# Patient Record
Sex: Male | Born: 1950 | Race: White | Hispanic: No | Marital: Married | Smoking: Never smoker
Health system: Southern US, Community
[De-identification: ages and names within clinical notes are randomized; demographics above are authoritative.]

## PROBLEM LIST (undated history)

## (undated) DIAGNOSIS — I10 Essential (primary) hypertension: Secondary | ICD-10-CM

## (undated) DIAGNOSIS — C801 Malignant (primary) neoplasm, unspecified: Secondary | ICD-10-CM

## (undated) DIAGNOSIS — G4730 Sleep apnea, unspecified: Secondary | ICD-10-CM

## (undated) DIAGNOSIS — E785 Hyperlipidemia, unspecified: Secondary | ICD-10-CM

## (undated) DIAGNOSIS — G4733 Obstructive sleep apnea (adult) (pediatric): Secondary | ICD-10-CM

## (undated) DIAGNOSIS — E875 Hyperkalemia: Secondary | ICD-10-CM

## (undated) DIAGNOSIS — R079 Chest pain, unspecified: Secondary | ICD-10-CM

## (undated) DIAGNOSIS — R9431 Abnormal electrocardiogram [ECG] [EKG]: Secondary | ICD-10-CM

## (undated) DIAGNOSIS — I2510 Atherosclerotic heart disease of native coronary artery without angina pectoris: Secondary | ICD-10-CM

## (undated) DIAGNOSIS — Z760 Encounter for issue of repeat prescription: Principal | ICD-10-CM

## (undated) HISTORY — DX: Malignant (primary) neoplasm, unspecified: C80.1

## (undated) HISTORY — DX: Sleep apnea, unspecified: G47.30

## (undated) HISTORY — PX: COLONOSCOPY: SHX174

## (undated) HISTORY — PX: APPENDECTOMY: SHX54

## (undated) HISTORY — DX: Hyperlipidemia, unspecified: E78.5

## (undated) HISTORY — DX: Obstructive sleep apnea (adult) (pediatric): G47.33

## (undated) HISTORY — PX: NASAL SEPTUM SURGERY: SHX37

## (undated) HISTORY — DX: Essential (primary) hypertension: I10

---

## 2002-02-19 ENCOUNTER — Encounter: Payer: Self-pay | Admitting: Internal Medicine

## 2004-02-23 ENCOUNTER — Ambulatory Visit (HOSPITAL_COMMUNITY): Admission: RE | Admit: 2004-02-23 | Discharge: 2004-02-23 | Payer: Self-pay | Admitting: Family Medicine

## 2004-02-26 ENCOUNTER — Ambulatory Visit (HOSPITAL_COMMUNITY): Admission: RE | Admit: 2004-02-26 | Discharge: 2004-02-26 | Payer: Self-pay | Admitting: Family Medicine

## 2005-01-06 ENCOUNTER — Ambulatory Visit (HOSPITAL_COMMUNITY): Admission: RE | Admit: 2005-01-06 | Discharge: 2005-01-06 | Payer: Self-pay | Admitting: Family Medicine

## 2008-03-20 HISTORY — PX: OTHER SURGICAL HISTORY: SHX169

## 2009-05-19 ENCOUNTER — Encounter: Payer: Self-pay | Admitting: Internal Medicine

## 2009-06-24 ENCOUNTER — Ambulatory Visit: Payer: Self-pay | Admitting: Internal Medicine

## 2009-06-24 DIAGNOSIS — G4733 Obstructive sleep apnea (adult) (pediatric): Secondary | ICD-10-CM

## 2009-06-24 DIAGNOSIS — J310 Chronic rhinitis: Secondary | ICD-10-CM

## 2009-06-24 LAB — CONVERTED CEMR LAB
Basophils Absolute: 0 10*3/uL (ref 0.0–0.1)
Basophils Relative: 0.5 % (ref 0.0–3.0)
Eosinophils Absolute: 0.2 10*3/uL (ref 0.0–0.7)
Eosinophils Relative: 3.8 % (ref 0.0–5.0)
HCT: 43.3 % (ref 39.0–52.0)
Hemoglobin: 14.8 g/dL (ref 13.0–17.0)
IgE (Immunoglobulin E), Serum: 15.2 intl units/mL (ref 0.0–180.0)
Lymphocytes Relative: 25.8 % (ref 12.0–46.0)
Lymphs Abs: 1.3 10*3/uL (ref 0.7–4.0)
MCHC: 34.3 g/dL (ref 30.0–36.0)
MCV: 92 fL (ref 78.0–100.0)
Monocytes Absolute: 0.4 10*3/uL (ref 0.1–1.0)
Monocytes Relative: 7 % (ref 3.0–12.0)
Neutro Abs: 3.2 10*3/uL (ref 1.4–7.7)
Neutrophils Relative %: 62.9 % (ref 43.0–77.0)
Platelets: 262 10*3/uL (ref 150.0–400.0)
RBC: 4.71 M/uL (ref 4.22–5.81)
RDW: 13.4 % (ref 11.5–14.6)
WBC: 5.1 10*3/uL (ref 4.5–10.5)

## 2009-08-18 ENCOUNTER — Ambulatory Visit: Payer: Self-pay | Admitting: Internal Medicine

## 2009-08-19 ENCOUNTER — Encounter: Payer: Self-pay | Admitting: Internal Medicine

## 2009-08-20 ENCOUNTER — Ambulatory Visit: Payer: Self-pay | Admitting: Internal Medicine

## 2009-08-24 ENCOUNTER — Ambulatory Visit: Payer: Self-pay | Admitting: Internal Medicine

## 2009-08-27 ENCOUNTER — Ambulatory Visit: Payer: Self-pay | Admitting: Internal Medicine

## 2009-09-01 ENCOUNTER — Ambulatory Visit: Payer: Self-pay | Admitting: Internal Medicine

## 2009-09-07 ENCOUNTER — Ambulatory Visit: Payer: Self-pay | Admitting: Internal Medicine

## 2009-09-10 ENCOUNTER — Ambulatory Visit: Payer: Self-pay | Admitting: Internal Medicine

## 2009-09-14 ENCOUNTER — Ambulatory Visit: Payer: Self-pay | Admitting: Internal Medicine

## 2009-09-17 ENCOUNTER — Ambulatory Visit: Payer: Self-pay | Admitting: Internal Medicine

## 2009-09-24 ENCOUNTER — Ambulatory Visit: Payer: Self-pay | Admitting: Internal Medicine

## 2009-09-28 ENCOUNTER — Ambulatory Visit: Payer: Self-pay | Admitting: Internal Medicine

## 2009-10-01 ENCOUNTER — Ambulatory Visit: Payer: Self-pay | Admitting: Internal Medicine

## 2009-10-05 ENCOUNTER — Ambulatory Visit: Payer: Self-pay | Admitting: Internal Medicine

## 2009-10-08 ENCOUNTER — Ambulatory Visit: Payer: Self-pay | Admitting: Internal Medicine

## 2009-10-12 ENCOUNTER — Ambulatory Visit: Payer: Self-pay | Admitting: Internal Medicine

## 2009-10-15 ENCOUNTER — Ambulatory Visit: Payer: Self-pay | Admitting: Internal Medicine

## 2009-10-26 ENCOUNTER — Ambulatory Visit: Payer: Self-pay | Admitting: Internal Medicine

## 2009-10-29 ENCOUNTER — Ambulatory Visit: Payer: Self-pay | Admitting: Internal Medicine

## 2009-11-01 ENCOUNTER — Ambulatory Visit: Payer: Self-pay | Admitting: Internal Medicine

## 2009-11-05 ENCOUNTER — Ambulatory Visit: Payer: Self-pay | Admitting: Internal Medicine

## 2009-11-08 ENCOUNTER — Ambulatory Visit: Payer: Self-pay | Admitting: Internal Medicine

## 2009-11-11 ENCOUNTER — Ambulatory Visit: Payer: Self-pay | Admitting: Internal Medicine

## 2009-11-15 ENCOUNTER — Ambulatory Visit: Payer: Self-pay | Admitting: Internal Medicine

## 2009-11-19 ENCOUNTER — Ambulatory Visit: Payer: Self-pay | Admitting: Internal Medicine

## 2009-11-24 ENCOUNTER — Ambulatory Visit: Payer: Self-pay | Admitting: Internal Medicine

## 2009-11-24 ENCOUNTER — Telehealth: Payer: Self-pay | Admitting: Internal Medicine

## 2009-11-26 ENCOUNTER — Ambulatory Visit: Payer: Self-pay | Admitting: Internal Medicine

## 2009-11-29 ENCOUNTER — Ambulatory Visit: Payer: Self-pay | Admitting: Internal Medicine

## 2009-11-30 ENCOUNTER — Ambulatory Visit: Payer: Self-pay | Admitting: Internal Medicine

## 2009-12-02 ENCOUNTER — Ambulatory Visit: Payer: Self-pay | Admitting: Internal Medicine

## 2009-12-06 ENCOUNTER — Ambulatory Visit: Payer: Self-pay | Admitting: Internal Medicine

## 2009-12-10 ENCOUNTER — Ambulatory Visit: Payer: Self-pay | Admitting: Internal Medicine

## 2009-12-13 ENCOUNTER — Ambulatory Visit: Payer: Self-pay | Admitting: Internal Medicine

## 2009-12-16 ENCOUNTER — Ambulatory Visit: Payer: Self-pay | Admitting: Internal Medicine

## 2009-12-24 ENCOUNTER — Ambulatory Visit: Payer: Self-pay | Admitting: Internal Medicine

## 2009-12-27 ENCOUNTER — Ambulatory Visit: Payer: Self-pay | Admitting: Internal Medicine

## 2009-12-31 ENCOUNTER — Ambulatory Visit: Payer: Self-pay | Admitting: Internal Medicine

## 2010-01-03 ENCOUNTER — Ambulatory Visit: Payer: Self-pay | Admitting: Internal Medicine

## 2010-01-10 ENCOUNTER — Ambulatory Visit: Payer: Self-pay | Admitting: Internal Medicine

## 2010-01-17 ENCOUNTER — Ambulatory Visit: Payer: Self-pay | Admitting: Internal Medicine

## 2010-01-24 ENCOUNTER — Ambulatory Visit: Payer: Self-pay | Admitting: Internal Medicine

## 2010-02-05 ENCOUNTER — Emergency Department (HOSPITAL_COMMUNITY)
Admission: EM | Admit: 2010-02-05 | Discharge: 2010-02-05 | Payer: Self-pay | Source: Home / Self Care | Admitting: Emergency Medicine

## 2010-02-07 ENCOUNTER — Ambulatory Visit: Payer: Self-pay | Admitting: Internal Medicine

## 2010-02-14 ENCOUNTER — Ambulatory Visit: Payer: Self-pay | Admitting: Internal Medicine

## 2010-02-25 ENCOUNTER — Ambulatory Visit: Payer: Self-pay | Admitting: Internal Medicine

## 2010-03-07 ENCOUNTER — Ambulatory Visit: Payer: Self-pay | Admitting: Internal Medicine

## 2010-03-07 ENCOUNTER — Telehealth (INDEPENDENT_AMBULATORY_CARE_PROVIDER_SITE_OTHER): Payer: Self-pay | Admitting: *Deleted

## 2010-03-25 ENCOUNTER — Telehealth: Payer: Self-pay | Admitting: Internal Medicine

## 2010-04-04 ENCOUNTER — Ambulatory Visit: Payer: Self-pay | Admitting: Internal Medicine

## 2010-04-06 ENCOUNTER — Telehealth (INDEPENDENT_AMBULATORY_CARE_PROVIDER_SITE_OTHER): Payer: Self-pay | Admitting: *Deleted

## 2010-04-06 ENCOUNTER — Ambulatory Visit: Payer: Self-pay | Admitting: Internal Medicine

## 2010-04-08 ENCOUNTER — Ambulatory Visit: Payer: Self-pay | Admitting: Internal Medicine

## 2010-04-12 ENCOUNTER — Encounter: Payer: Self-pay | Admitting: Internal Medicine

## 2010-04-18 ENCOUNTER — Ambulatory Visit: Payer: Self-pay | Admitting: Internal Medicine

## 2010-04-19 NOTE — Assessment & Plan Note (Signed)
Summary: allergy testing/apc   Vital Signs:  Patient profile:   60 year old male Height:      69 inches Weight:      179 pounds BMI:     26.53 O2 Sat:      96 % on Room air Pulse rate:   83 / minute BP sitting:   118 / 82  (left arm) Cuff size:   regular  Vitals Entered By: Reynaldo Minium CMA (August 18, 2009 2:22 PM)  O2 Flow:  Room air  Primary Provider/Referring Provider:  Eric Form  CC:  Allergy testing.  History of Present Illness: CC:  Allergy Consult-Dr.Crossley.  History of Present Illness: June 24, 2009- 59 yoM referred courtesy of Dr Haroldine Laws, asking allergy evaluation. Complains of chronic, adult onset sneeze, retrorbital pressure, nasal congestion, watering eyes. 30 years ago these were more seasonal and he was skin tested in [place], but never on allergy vaccine. now symptoms have become more perennial. he has OSA, dx'd [facility] 02/19/02. Rhinitis is now beginning to interfere with CPAP use. He had septoplasty by Dr Haroldine Laws and further surgery for OSA has been discussed. Has tried Allegra, Clarinex, Nasonex- no help (other nasal sprays burned). Rare use of decongestants. No intolerance to aspirin, contrast dye or latex, foods or stinging insects.  August 18, 2009- rhinitis, OSA/ CPAP Comes for skin testing reporting no major changes and comfortable since last here. They had travelled to [place], noting no change in symptoms while away. CBC- WNL, Eos not elevated IgE- 15.2 Allergy specific IgE profile- not elevated. Skin test- Pos grass, tree, dustmite   Current Medications (verified): 1)  Benicar Hct 20-12.5 Mg Tabs (Olmesartan Medoxomil-Hctz) .... Take 1 By Mouth Once Daily 2)  Allegra 180 Mg Tabs (Fexofenadine Hcl) .... Take 1 By Mouth Once Daily As Needed 3)  Clarinex 5 Mg Tabs (Desloratadine) .... Take 1 By Mouth Once Daily As Needed 4)  Cpap 5)  Adderall Xr 20 Mg Xr24h-Cap (Amphetamine-Dextroamphetamine) .... Take 1 By Mouth  Every  Morning  Allergies (verified): 1)  ! Demerol 2)  ! Codeine  Past History:  Past Medical History: Last updated: 06/24/2009 Allergic rhinitis Obstructive Sleep Apnea- NPSG 02/19/02- RDI 20.2/hr  Past Surgical History: Last updated: 06/24/2009 Nasal septoplasty Appendectomy  Family History: Last updated: 06/24/2009 Allergies-Mother and Father Cancer-Father-skin and prostate.  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    Physical Exam  Mouth:  postnasal drip.   Additional Exam:  General: A/Ox3; pleasant and cooperative, NAD, SKIN: no rash, lesions, very tanned NODES: no lymphadenopathy HEENT: [place]/AT, EOM- WNL, Conjuctivae- clear, PERRLA, TM-WNL, Nose- septal deviation to left, mucus bridging, Throat- clear and wnl, Mallampati  II NECK: Supple w/ fair ROM, JVD- none, normal carotid impulses w/o bruits Thyroid- normal to palpation CHEST: Clear to P&A HEART: RRR, no m/g/r heard ABDOMEN: Soft and nl; HYQ:MVHQ, nl pulses, no edema  NEURO: Grossly intact to observation      Impression & Recommendations:  Problem # 1:  CHRONIC RHINITIS (ICD-472.0)  Positive allergy skin tests, supporting awareness that there is an allergic  component to his rhinitis. We reviewed and gave print info on environmental precautions/ dust control. We discussed allergy vaccine risk benefit and potential for anaphyllaxis. He says he came thnking he was ready to try vaccine. Nothing else has worked well. We  agreed to start and build here,  Orders: Est. Patient Level III (04540) Allergy Puncture Test (98119) Allergy I.D Test (14782)  Problem # 2:  OBSTRUCTIVE SLEEP APNEA (ICD-327.23)  He is compliant and successful with CPAP. No changes needed.  Orders: Est. Patient Level III (95621) Allergy Puncture Test (30865)  Medications Added to Medication List This Visit: 1)  Adderall Xr 20 Mg Xr24h-cap (Amphetamine-dextroamphetamine) .... Take 1 by mouth  every morning  Patient Instructions: 1)  Please schedule a follow-up appointment in 2 months. 2)  I will have allergy lab call you when your allergy vaccine is ready to start.

## 2010-04-19 NOTE — Miscellaneous (Signed)
Summary: Injection Record / [facility]    Injection Record / [facility]    Imported By: Lennie Odor 11/19/2009 09:49:34  _____________________________________________________________________  External Attachment:    Type:   Image     Comment:   External Document

## 2010-04-19 NOTE — Miscellaneous (Signed)
Summary: Order/[place] Christopher Wilcox  Order/[place] Christopher Wilcox   Imported By: Sherian Rein 09/09/2009 08:07:08  _____________________________________________________________________  External Attachment:    Type:   Image     Comment:   External Document

## 2010-04-19 NOTE — Assessment & Plan Note (Signed)
Summary: 2 months/apc   Primary Provider/Referring Provider:  Eric Form  CC:  2 month follow up visit-allergies and asthma. "doing good" .  History of Present Illness: August 18, 2009- rhinitis, OSA/ CPAP Comes for skin testing reporting no major changes and comfortable since last here. They had travelled to [place], noting no change in symptoms while away. CBC- WNL, Eos not elevated IgE- 15.2 Allergy specific IgE profile- not elevated. Skin test- Pos grass, tree, dustmite  November 19, 2009- Rhinits, OSA/ CPAP  Minor increased stuffiness in last 2 weeks consistent with ragweed season.  Allegra 180 usually sufficent. Occasionally takes a sudafed.  Continues CPAP every night- originally prescribed in [facility] by Dr DeCoy who no longer practices. Unknown pressure with a nasal mask. ? [facility]? Gets Adderall from Dr Clelia Croft. Alllergy vaccine is building without problems.       Preventive Screening-Counseling & Management  Alcohol-Tobacco     Smoking Status: never  Current Medications (verified): 1)  Benicar Hct 20-12.5 Mg Tabs (Olmesartan Medoxomil-Hctz) .... Take 1/2  By Mouth Once Daily 2)  Allegra 180 Mg Tabs (Fexofenadine Hcl) .... Take 1 By Mouth Once Daily As Needed 3)  Clarinex 5 Mg Tabs (Desloratadine) .... Take 1 By Mouth Once Daily As Needed 4)  Cpap 5)  Adderall Xr 20 Mg Xr24h-Cap (Amphetamine-Dextroamphetamine) .... Take 1 By Mouth  Every Morning  Allergies (verified): 1)  ! Demerol 2)  ! Codeine  Past History:  Past Medical History: Last updated: 06/24/2009 Allergic rhinitis Obstructive Sleep Apnea- NPSG 02/19/02- RDI 20.2/hr  Past Surgical History: Last updated: 06/24/2009 Nasal septoplasty Appendectomy  Family History: Last updated: 06/24/2009 Allergies-Mother and Father Cancer-Father-skin and prostate.  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    Vital Signs:  Patient profile:   60 year old male Height:      69 inches Weight:      179.50 pounds BMI:     26.60 O2 Sat:      99 % on Room air Pulse rate:   86 / minute BP sitting:   116 / 68  (right arm) Cuff size:   regular  Vitals Entered By: Reynaldo Minium CMA (November 19, 2009 10:38 AM)  O2 Flow:  Room air CC: 2 month follow up visit-allergies and asthma. "doing good"    Physical Exam  Additional Exam:  General: A/Ox3; pleasant and cooperative, NAD, SKIN: no rash, lesions, very tanned NODES: no lymphadenopathy HEENT: [place]/AT, EOM- WNL, Conjuctivae- clear, PERRLA, TM-WNL, Nose- septal deviation to left, mucus bridging, Throat- clear and wnl, Mallampati  II NECK: Supple w/ fair ROM, JVD- none, normal carotid impulses w/o bruits Thyroid- normal to palpation CHEST: Clear to P&A HEART: RRR, no m/g/r heard ABDOMEN: Soft and nl; ZOX:WRUE, nl pulses, no edema  NEURO: Grossly intact to observation      Impression & Recommendations:  Problem # 1:  CHRONIC RHINITIS (ICD-472.0)  Allergy vaccine is building well and he is appropriate with antihistamines and decongestants. At next visit I will probably take vaccine up to 1:10.  Problem # 2:  OBSTRUCTIVE SLEEP APNEA (ICD-327.23)  We will try to track down a copy of his original sleep study and find out his current pressure setting so we will have it when needd.  Medications Added to Medication List This Visit: 1)  Benicar Hct 20-12.5 Mg Tabs (Olmesartan medoxomil-hctz) .... Take 1/2  by mouth once daily 2)  Allergy Vaccine Building   Other Orders: Est. Patient Level III  ([address]) DME Referral (DME)  Patient Instructions: 1)  Please schedule a follow-up appointment in 6 months. 2)  I will ask our [facility] to work with you to get a copy of your original diagnostic sleep study if available and to check with your home care DME company to find your current CPAP pressure. 3)  At your next visit I anticipate taking your vaccine up one last concentration step.

## 2010-04-19 NOTE — Progress Notes (Signed)
Summary: returning call  Phone Note Call from Patient Call back at Work Phone [phone]   Caller: Patient Call For: young Summary of Call: Returning Libby's call. Initial call taken by: Darletta Moll,  November 24, 2009 3:38 PM  Follow-up for Phone Call        I see where msg was left- unsure what was needed exactly so will forward this msg to [facility] box.   Follow-up by: Vernie Murders,  November 24, 2009 3:47 PM  Additional Follow-up for Phone Call Additional follow up Details #1::        Called Dr. Allayne Stack office and obtained Polysomnogram report from 02/19/02. Rhonda Cobb  November 24, 2009 4:31 PM Gave report to Dr. Maple Hudson who stated that this study from 02/19/02 would work, that this was a diag. study. LMOAM for pt that this study we recvd. would work and there wouldn't be any need for pt to repeat sleep study. Advised pt to call me if he had any questions. Rhonda Cobb  November 24, 2009 5:25 PM Report given to Dr. Maple Hudson. Alfonso Ramus  November 24, 2009 5:26 PM

## 2010-04-19 NOTE — Miscellaneous (Signed)
Summary: Injection Record / [facility]    Injection Record / [facility]    Imported By: Lennie Odor 01/25/2010 14:57:07  _____________________________________________________________________  External Attachment:    Type:   Image     Comment:   External Document

## 2010-04-19 NOTE — Letter (Signed)
Summary: Hermelinda Medicus MD  Hermelinda Medicus MD   Imported By: Sherian Rein 06/30/2009 10:22:44  _____________________________________________________________________  External Attachment:    Type:   Image     Comment:   External Document

## 2010-04-19 NOTE — Assessment & Plan Note (Signed)
Summary: allergy problem/ mbw   Vital Signs:  Patient profile:   60 year old male Height:      69 inches Weight:      180.13 pounds BMI:     26.70 O2 Sat:      96 % on Room air Pulse rate:   89 / minute BP sitting:   110 / 80  (left arm) Cuff size:   regular  Vitals Entered By: Reynaldo Minium CMA (June 24, 2009 3:27 PM)  O2 Flow:  Room air  Primary Provider/Referring Provider:  Eric Form  CC:  Allergy Consult-Dr.Crossley.  History of Present Illness: June 24, 2009- 59 yoM referred courtesy of Dr Haroldine Laws, asking allergy evaluation. Complains of chronic, adult onset sneeze, retrorbital pressure, nasal congestion, watering eyes. 30 years ago these were more seasonal and he was skin tested in [place], but never on allergy vaccine. now symptoms have become more perennial. he has OSA, dx'd [facility] 02/19/02. Rhinitis is now beginning to interfere with CPAP use. He had septoplasty by Dr Haroldine Laws and further surgery for OSA has been discussed. Has tried Allegra, Clarinex, Nasonex- no help (other nasal sprays burned). Rare use of decongestants. No intolerance to aspirin, contrast dye or latex, foods or stinging insects.  Current Medications (verified): 1)  Benicar Hct 20-12.5 Mg Tabs (Olmesartan Medoxomil-Hctz) .... Take 1 By Mouth Once Daily 2)  Allegra 180 Mg Tabs (Fexofenadine Hcl) .... Take 1 By Mouth Once Daily As Needed 3)  Clarinex 5 Mg Tabs (Desloratadine) .... Take 1 By Mouth Once Daily As Needed 4)  Vyvanse 50 Mg Caps (Lisdexamfetamine Dimesylate) .... Take 1 By Mouth Once Daily 5)  Cpap  Allergies (verified): 1)  ! Demerol 2)  ! Codeine  Past History:  Family History: Last updated: 06/24/2009 Allergies-Mother and Father Cancer-Father-skin and prostate.  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  The patient denies shortness of breath with activity, shortness of breath at rest, productive cough, non-productive cough, coughing up blood, chest pain, irregular heartbeats, acid heartburn, indigestion, loss of appetite, weight change, abdominal pain, difficulty swallowing, sore throat, tooth/dental problems, headaches, depression, hand/feet swelling, joint stiffness or pain, rash, change in color of mucus, and fever.    Physical Exam  Additional Exam:  General: A/Ox3; pleasant and cooperative, NAD, SKIN: no rash, lesions NODES: no lymphadenopathy HEENT: [place]/AT, EOM- WNL, Conjuctivae- clear, PERRLA, TM-WNL, Nose- septal deviation to left, mucus bridging, Throat- clear and wnl, Mallampati  II NECK: Supple w/ fair ROM, JVD- none, normal carotid impulses w/o bruits Thyroid- normal to palpation CHEST: Clear to P&A HEART: RRR, no m/g/r heard ABDOMEN: Soft and nl; nml bowel sounds; no organomegaly or masses noted ZOX:WRUE, nl pulses, no edema  NEURO: Grossly intact to observation      Impression & Recommendations:  Problem # 1:  CHRONIC RHINITIS (ICD-472.0)  We disccussed allergic vs nonallergic problems and will send in vitro IgE evaliuation then return for allergy skin testing.   Problem # 2:  OBSTRUCTIVE SLEEP APNEA (ICD-327.23)  He is using cpap with pretty good compliance. He isn't describing breakthrough snore or daytime somnolence and his weight is stable, so I don't think we need pressure assessment yet. He is able to adjust the setting of his humidifier and is encouraged to experiment with that for  effect on morning nasaal symptoms. he and Dr Haroldine Laws will discuss surgical alternatives.  Medications Added to Medication List This Visit: 1)  Benicar Hct 20-12.5 Mg Tabs (Olmesartan medoxomil-hctz) .... Take 1 by mouth once daily 2)  Allegra 180 Mg Tabs (Fexofenadine hcl) .... Take 1 by mouth once daily as needed 3)  Clarinex 5 Mg Tabs (Desloratadine) .... Take 1 by mouth once daily as needed 4)  Vyvanse 50 Mg Caps (Lisdexamfetamine dimesylate) .... Take 1 by mouth once daily 5)  Cpap   Other Orders: Consultation Level IV (62130) TLB-CBC Platelet - w/Differential (85025-CBCD) T-"RAST" (Allergy Full Profile) IGE ([id number])  Patient Instructions: 1)  Return as able for allergy skin testing.- Stop all antihistamines 3 days before skin testing, including cold and allergy meds, otc sleep and cough meds.  2)  Try adjusting the humidifier on your cpap up and down a little to see if that makes you more comfortable in the morning. 3)  Please call us back with the name of your CPAP DME company so we can check on settings for our record. 4)  Try sample Omnaris- 2 sprays each  nostril once daily every night at bedtime til you use up the sample.

## 2010-04-19 NOTE — Miscellaneous (Signed)
Summary: Skin Test/[facility]  Skin Test/[facility]   Imported By: Sherian Rein 08/25/2009 13:05:31  _____________________________________________________________________  External Attachment:    Type:   Image     Comment:   External Document

## 2010-04-21 NOTE — Progress Notes (Signed)
Summary: Nasonex not covered-lmtcb x 3  Phone Note From Pharmacy   [facility] Call For: CY  Summary of Call: Re[facility] stating that nasonex is not covered on pt plan. Alternatives are Fluticasone Propionate nasal spray, or Flunisolide Nasal Spray 0.025% , or Triamcinolone ACE nasal spray. Please advise if any of these alternatives are ok for this pt. If not we can initiate a prior authorization.  Initial call taken by: Carron Curie CMA,  April 06, 2010 10:19 AM  Follow-up for Phone Call        ok to change medlist to fluticasone/ Flonase 50 micrograms # 3,   ref x 3 1-2 puffs each nostril once daily   Follow-up by: Waymon Budge MD,  April 06, 2010 2:51 PM  Additional Follow-up for Phone Call Additional follow up Details #1::        Need to let the pt know about this prior to sending in new rx- LMTCB x 1 Vernie Murders  April 06, 2010 2:56 PM   [facility]. Aundra Millet Reynolds LPN  April 07, 2010 4:43 PM   LMOM TCB x3. Boone Master CNA/MA  April 11, 2010 5:14 PM     Additional Follow-up for Phone Call Additional follow up Details #2::    spoke to pt and he is fine with the generic flonase and would like [facility]--rx sent  Follow-up by: Philipp Deputy CMA,  April 11, 2010 5:24 PM  New/Updated Medications: FLUTICASONE PROPIONATE 50 MCG/ACT SUSP (FLUTICASONE PROPIONATE) 2 sprays each nostril once daily Prescriptions: FLUTICASONE PROPIONATE 50 MCG/ACT SUSP (FLUTICASONE PROPIONATE) 2 sprays each nostril once daily  #3 x 3   Entered by:   Philipp Deputy CMA   Authorized by:   Waymon Budge MD   Signed by:   Philipp Deputy CMA on 04/11/2010   Method used:   Faxed to ...       [facility] Environmental education officer)       [address] [address]       [place], [place]  [address]       Ph: [phone]       Fax: [phone]   RxID:   [id number]

## 2010-04-21 NOTE — Progress Notes (Signed)
Summary: refill   Phone Note Call from Patient Call back at Work Phone [phone]   Caller: Patient Call For: Antonie Borjon Summary of Call: requests refill of nasonex- [facility] Initial call taken by: Tivis Ringer, CNA,  March 25, 2010 5:00 PM  Follow-up for Phone Call        rx sent. pt aware.Carron Curie CMA  March 25, 2010 5:11 PM     Prescriptions: NASONEX 50 MCG/ACT SUSP (MOMETASONE FUROATE) Use 2 puffs in each nostril at bedtime  #1 x 3   Entered by:   Carron Curie CMA   Authorized by:   Waymon Budge MD   Signed by:   Carron Curie CMA on 03/25/2010   Method used:   Faxed to ...       [facility] Environmental education officer)       [address] [address]       [place], [place]  [address]       Ph: [phone]       Fax: [phone]   RxID:   [id number]

## 2010-04-21 NOTE — Progress Notes (Signed)
Summary: patient in lobby--sample  Phone Note Call from Patient Call back at Home Phone [phone]   Caller: Patient Call For: young Reason for Call: Talk to Nurse Summary of Call: Patient asking if we have a sample of nasal spray..he has had a lot of sneezing and stuffiness.  Patient in lobby. Initial call taken by: Lehman Prom,  March 07, 2010 1:18 PM  Follow-up for Phone Call        Per Dr Wyvonne Lenz to try Nasonex 2 puffs each nostril at bedtime.  Sample given. Abigail Miyamoto RN  March 07, 2010 1:25 PM     New/Updated Medications: NASONEX 50 MCG/ACT SUSP (MOMETASONE FUROATE) Use 2 puffs in each nostril at bedtime

## 2010-04-21 NOTE — Medication Information (Signed)
Summary: Tax adviser   Imported By: Valinda Hoar 04/12/2010 09:12:45  _____________________________________________________________________  External Attachment:    Type:   Image     Comment:   External Document

## 2010-04-22 DIAGNOSIS — J301 Allergic rhinitis due to pollen: Secondary | ICD-10-CM

## 2010-05-06 ENCOUNTER — Ambulatory Visit (INDEPENDENT_AMBULATORY_CARE_PROVIDER_SITE_OTHER): Payer: Commercial Managed Care - PPO

## 2010-05-06 DIAGNOSIS — J301 Allergic rhinitis due to pollen: Secondary | ICD-10-CM

## 2010-05-11 NOTE — Miscellaneous (Signed)
Summary: Injection Financial risk analyst   Imported By: Sherian Rein 05/04/2010 15:03:21  _____________________________________________________________________  External Attachment:    Type:   Image     Comment:   External Document

## 2010-05-13 ENCOUNTER — Ambulatory Visit (INDEPENDENT_AMBULATORY_CARE_PROVIDER_SITE_OTHER): Payer: Commercial Managed Care - PPO

## 2010-05-13 DIAGNOSIS — J301 Allergic rhinitis due to pollen: Secondary | ICD-10-CM

## 2010-05-18 ENCOUNTER — Ambulatory Visit: Payer: Self-pay | Admitting: Internal Medicine

## 2010-05-20 ENCOUNTER — Ambulatory Visit (INDEPENDENT_AMBULATORY_CARE_PROVIDER_SITE_OTHER): Payer: Commercial Managed Care - PPO

## 2010-05-23 ENCOUNTER — Encounter: Payer: Self-pay | Admitting: Internal Medicine

## 2010-05-23 DIAGNOSIS — J301 Allergic rhinitis due to pollen: Secondary | ICD-10-CM

## 2010-05-31 NOTE — Assessment & Plan Note (Signed)
Summary: ALLERGY/CB  Nurse Visit   Allergies: 1)  ! Demerol 2)  ! Codeine  Orders Added: 1)  Allergy Injection (1) [[address]]

## 2010-06-06 ENCOUNTER — Ambulatory Visit (INDEPENDENT_AMBULATORY_CARE_PROVIDER_SITE_OTHER): Payer: Commercial Managed Care - PPO

## 2010-06-06 ENCOUNTER — Encounter: Payer: Self-pay | Admitting: Internal Medicine

## 2010-06-06 DIAGNOSIS — J301 Allergic rhinitis due to pollen: Secondary | ICD-10-CM

## 2010-06-06 LAB — ELECTROLYTE PANEL
Anion Gap: 11 mmol/L (ref 8–16)
CO2: 31 mmol/L (ref 20–31)
Chloride: 108 mmol/L (ref 98–110)
Potassium: 4.4 mmol/L (ref 3.5–5.1)
Sodium: 145 mmol/L (ref 136–145)

## 2010-06-06 LAB — CBC WITH DIFFERENTIAL
Absolute Baso #: 0.1 10*3/uL (ref 0.0–0.2)
Absolute Eos #: 0.3 10*3/uL (ref 0.0–0.4)
Absolute Lymph #: 3.4 10*3/uL (ref 1.0–4.8)
Absolute Mono #: 0.8 10*3/uL (ref 0.1–1.2)
Absolute Neut #: 4 10*3/uL (ref 1.8–7.7)
Basophils: 1 % (ref 0–2)
Eosinophils %: 4 % (ref 1–4)
Hematocrit: 41.3 % (ref 41–53)
Hemoglobin: 14.1 g/dL (ref 13.5–17.5)
Lymphocytes: 40 % (ref 24–44)
MCH: 30.9 pg (ref 26–34)
MCHC: 34 g/dL (ref 31–37)
MCV: 90.8 fL (ref 80–100)
MPV: 9.3 fL (ref 6.0–12.0)
Monocytes: 9 % (ref 2–11)
Platelet Count: 263 10*3/uL (ref 140–450)
RBC: 4.55 m/uL (ref 4.5–5.9)
RDW: 13.6 % (ref 12.5–15.4)
Seg Neutrophils: 46 % (ref 36–66)
WBC: 8.6 10*3/uL (ref 3.5–11.0)

## 2010-06-06 LAB — HEPATIC FUNCTION PANEL
ALT: 39 U/L (ref 4–40)
AST: 25 U/L (ref 8–36)
Albumin: 4.4 g/dL (ref 3.4–4.8)
Alkaline Phosphatase: 108 U/L — ABNORMAL HIGH (ref 25–100)
Bilirubin, Direct: 0.17 mg/dL (ref 0.0–0.3)
Bilirubin, Indirect: 0.31 mg/dL (ref 0.0–1.0)
Protein, Total: 7 g/dL (ref 6.4–8.3)
Total Bilirubin: 0.48 mg/dL (ref 0.3–1.2)

## 2010-06-06 LAB — CREATININE
Creatinine: 1.03 mg/dL (ref 0.6–1.4)
GFR African American: >60 mL/min (ref >60)
GFR Non-African American: >60 mL/min (ref >60)

## 2010-06-06 LAB — LIPID PANEL
Chol/HDL Ratio: 3.9 (ref <5.0)
Cholesterol: 135 mg/dL (ref <200)
HDL: 35 mg/dL — ABNORMAL LOW (ref >40)
LDL Cholesterol: 70 mg/dL (ref <100)
Triglycerides: 151 mg/dL — ABNORMAL HIGH (ref <150)

## 2010-06-06 LAB — TSH: TSH: 3.16 mIU/L (ref 0.35–5.50)

## 2010-06-06 LAB — PSA SCREENING: PSA: 0.69 ug/L (ref 0–4)

## 2010-06-06 LAB — GLUCOSE, RANDOM: Glucose: 93 mg/dL (ref 74–106)

## 2010-06-06 LAB — BUN: BUN: 15 mg/dL (ref 6–20)

## 2010-06-06 LAB — T4, FREE: Thyroxine, Free: 1.06 ng/dL (ref 0.80–2.00)

## 2010-06-13 ENCOUNTER — Ambulatory Visit (INDEPENDENT_AMBULATORY_CARE_PROVIDER_SITE_OTHER): Payer: Commercial Managed Care - PPO

## 2010-06-13 DIAGNOSIS — J301 Allergic rhinitis due to pollen: Secondary | ICD-10-CM

## 2010-06-14 ENCOUNTER — Encounter: Payer: Self-pay | Admitting: Internal Medicine

## 2010-06-16 ENCOUNTER — Encounter: Payer: Self-pay | Admitting: Internal Medicine

## 2010-06-16 ENCOUNTER — Ambulatory Visit (INDEPENDENT_AMBULATORY_CARE_PROVIDER_SITE_OTHER): Payer: Commercial Managed Care - PPO | Admitting: Internal Medicine

## 2010-06-16 VITALS — BP 118/70 | HR 111 | Ht 69.0 in | Wt 178.0 lb

## 2010-06-16 DIAGNOSIS — G4733 Obstructive sleep apnea (adult) (pediatric): Secondary | ICD-10-CM

## 2010-06-16 DIAGNOSIS — J301 Allergic rhinitis due to pollen: Secondary | ICD-10-CM

## 2010-06-16 NOTE — Assessment & Plan Note (Signed)
Summary: ALLERGY/CB  Nurse Visit   Allergies: 1)  ! Demerol 2)  ! Codeine  Orders Added: 1)  Allergy Injection (1) [[address]] 

## 2010-06-16 NOTE — Assessment & Plan Note (Addendum)
He is using a nasal steroid spray routinely and feels that and allergy vaccine have helped. There may be room for Dr Haroldine Laws to address a fixed nasal obstruction surgically with hopes of improving airflow and CPAP compliance.

## 2010-06-16 NOTE — Progress Notes (Signed)
  Subjective:    Patient ID: Christopher Wilcox, male    DOB: [birth year], 60 y.o.   MRN: [id number]  HPI 74 yoM never smoker, followed here for allergic rhinitis and OSA on CPAP through [facility] at unknown pressure. Sometimes leaves CPAP off because of nonseasonal stuffiness. Did have nasal surgery 20 years ago.- Dr Haroldine Laws. Does use CPAP at least 4 hours on most days, and is not aware of snore or daytime sleepiness. .Allergy vaccine started 08/20/09 is now stable and seems to him to be helping, at 1:50 [facility] with no problems.  Still uses an Adderall ER 20 mg once on most days for residual daytime sleepiness- Dr Clelia Croft. He has talked w/ Dr Haroldine Laws about possible nasal septoplasty. .   Review of Systems See HPI Constitutional:   No weight loss, night sweats,  Fevers, chills, fatigue, lassitude. HEENT:   No headaches,  Difficulty swallowing,  Tooth/dental problems,  Sore throat,                No sneezing, itching, ear ache, Occasional-  nasal congestion, post nasal drip,   CV:  No chest pain,  Orthopnea, PND, swelling in lower extremities, anasarca, dizziness, palpitations  GI  No heartburn, indigestion, abdominal pain, nausea, vomiting, diarrhea, change in bowel habits, loss of appetite  Resp: No shortness of breath with exertion or at rest.  No excess mucus, no productive cough,  No non-productive cough,  No coughing up of blood.  No change in color of mucus.  No wheezing  Skin: no rash or lesions.  GU: no dysuria, change in color of urine, no urgency or frequency.  No flank pain.  MS:  No joint pain or swelling.  No decreased range of motion.  No back pain.  Psych:  No change in mood or affect. No depression or anxiety.  No memory loss.     Objective:   Physical Exam General- Alert, Oriented, Affect-appropriate, Distress- none acute  Skin- rash-none, lesions- none, excoriation- none  Lymphadenopathy- none  Head- atraumatic  Eyes- Gross vision intact, PERRLA, conjunctivae clear,  secretions  Ears- Normal-  Hearing, canals, Tm L ,   R ,  Nose- Clear,  MinimalSeptal dev, No- mucus, polyps, erosion, perforation   Throat- Mallampati II , mucosa clear , drainage- none, tonsils- atrophic  Neck- flexible , trachea midline, no stridor , thyroid nl, carotid no bruit  Chest - symmetrical excursion , unlabored     Heart/CV- RRR , no murmur , no gallop  , no rub, nl s1 s2                     - JVD- none , edema- none, stasis changes- none, varices- none     Lung- clear to P&A, wheeze- none, cough- none , dullness-none, rub- none     Chest wall-   Abd- tender-no, distended-no, bowel sounds-present, HSM- no  Br/ Gen/ Rectal- Not done, not indicated  Extrem- cyanosis- none, clubbing, none, atrophy- none, strength- nl  Neuro- grossly intact to observation        Assessment & Plan:

## 2010-06-16 NOTE — Assessment & Plan Note (Signed)
We need to try to get his pressure setting from his DME. His compliance is adequate and , effectivenes is good. , and limited more by his intermittent nonspecific stuffiness.

## 2010-06-16 NOTE — Patient Instructions (Signed)
We will have the allergy lab advance your vaccine to 1:10 strength with the next order.  We will contact [facility] for your current CPAP setting for our record.   It is ok to explore with Dr Haroldine Laws whether you want to try nasal surgery.

## 2010-06-18 ENCOUNTER — Encounter: Payer: Self-pay | Admitting: Internal Medicine

## 2010-06-22 ENCOUNTER — Ambulatory Visit (INDEPENDENT_AMBULATORY_CARE_PROVIDER_SITE_OTHER): Payer: Commercial Managed Care - PPO

## 2010-06-22 ENCOUNTER — Encounter: Payer: Self-pay | Admitting: Internal Medicine

## 2010-06-22 DIAGNOSIS — J301 Allergic rhinitis due to pollen: Secondary | ICD-10-CM

## 2010-07-04 ENCOUNTER — Ambulatory Visit (INDEPENDENT_AMBULATORY_CARE_PROVIDER_SITE_OTHER): Payer: Commercial Managed Care - PPO

## 2010-07-04 DIAGNOSIS — J309 Allergic rhinitis, unspecified: Secondary | ICD-10-CM

## 2010-07-12 ENCOUNTER — Ambulatory Visit (INDEPENDENT_AMBULATORY_CARE_PROVIDER_SITE_OTHER): Payer: Commercial Managed Care - PPO

## 2010-07-12 DIAGNOSIS — J309 Allergic rhinitis, unspecified: Secondary | ICD-10-CM

## 2010-07-25 ENCOUNTER — Ambulatory Visit (INDEPENDENT_AMBULATORY_CARE_PROVIDER_SITE_OTHER): Payer: Commercial Managed Care - PPO

## 2010-07-25 DIAGNOSIS — J309 Allergic rhinitis, unspecified: Secondary | ICD-10-CM

## 2010-08-01 ENCOUNTER — Ambulatory Visit (INDEPENDENT_AMBULATORY_CARE_PROVIDER_SITE_OTHER): Payer: Commercial Managed Care - PPO

## 2010-08-01 DIAGNOSIS — J309 Allergic rhinitis, unspecified: Secondary | ICD-10-CM

## 2010-08-08 ENCOUNTER — Ambulatory Visit (INDEPENDENT_AMBULATORY_CARE_PROVIDER_SITE_OTHER): Payer: Commercial Managed Care - PPO

## 2010-08-08 DIAGNOSIS — J309 Allergic rhinitis, unspecified: Secondary | ICD-10-CM

## 2010-08-17 ENCOUNTER — Ambulatory Visit (INDEPENDENT_AMBULATORY_CARE_PROVIDER_SITE_OTHER): Payer: Commercial Managed Care - PPO

## 2010-08-17 DIAGNOSIS — J309 Allergic rhinitis, unspecified: Secondary | ICD-10-CM

## 2010-08-22 ENCOUNTER — Encounter: Payer: Self-pay | Admitting: Internal Medicine

## 2010-08-24 ENCOUNTER — Encounter: Payer: Self-pay | Admitting: Gastroenterology

## 2010-08-29 ENCOUNTER — Ambulatory Visit (INDEPENDENT_AMBULATORY_CARE_PROVIDER_SITE_OTHER): Payer: Commercial Managed Care - PPO

## 2010-08-29 DIAGNOSIS — J309 Allergic rhinitis, unspecified: Secondary | ICD-10-CM

## 2010-09-09 ENCOUNTER — Ambulatory Visit (INDEPENDENT_AMBULATORY_CARE_PROVIDER_SITE_OTHER): Payer: Commercial Managed Care - PPO

## 2010-09-09 DIAGNOSIS — J309 Allergic rhinitis, unspecified: Secondary | ICD-10-CM

## 2010-09-19 ENCOUNTER — Ambulatory Visit (INDEPENDENT_AMBULATORY_CARE_PROVIDER_SITE_OTHER): Payer: Commercial Managed Care - PPO

## 2010-09-19 DIAGNOSIS — J309 Allergic rhinitis, unspecified: Secondary | ICD-10-CM

## 2010-09-20 ENCOUNTER — Ambulatory Visit (INDEPENDENT_AMBULATORY_CARE_PROVIDER_SITE_OTHER): Payer: Commercial Managed Care - PPO

## 2010-09-20 DIAGNOSIS — J309 Allergic rhinitis, unspecified: Secondary | ICD-10-CM

## 2010-09-26 ENCOUNTER — Ambulatory Visit (INDEPENDENT_AMBULATORY_CARE_PROVIDER_SITE_OTHER): Payer: Commercial Managed Care - PPO

## 2010-09-26 DIAGNOSIS — J309 Allergic rhinitis, unspecified: Secondary | ICD-10-CM

## 2010-10-04 ENCOUNTER — Ambulatory Visit (INDEPENDENT_AMBULATORY_CARE_PROVIDER_SITE_OTHER): Payer: Commercial Managed Care - PPO

## 2010-10-04 DIAGNOSIS — J309 Allergic rhinitis, unspecified: Secondary | ICD-10-CM

## 2010-10-13 ENCOUNTER — Ambulatory Visit (INDEPENDENT_AMBULATORY_CARE_PROVIDER_SITE_OTHER): Payer: Commercial Managed Care - PPO

## 2010-10-13 DIAGNOSIS — J309 Allergic rhinitis, unspecified: Secondary | ICD-10-CM

## 2010-10-21 ENCOUNTER — Ambulatory Visit (INDEPENDENT_AMBULATORY_CARE_PROVIDER_SITE_OTHER): Payer: Commercial Managed Care - PPO

## 2010-10-21 DIAGNOSIS — J309 Allergic rhinitis, unspecified: Secondary | ICD-10-CM

## 2010-11-01 ENCOUNTER — Ambulatory Visit (INDEPENDENT_AMBULATORY_CARE_PROVIDER_SITE_OTHER): Payer: Commercial Managed Care - PPO

## 2010-11-01 DIAGNOSIS — J309 Allergic rhinitis, unspecified: Secondary | ICD-10-CM

## 2010-11-08 ENCOUNTER — Ambulatory Visit (INDEPENDENT_AMBULATORY_CARE_PROVIDER_SITE_OTHER): Payer: Commercial Managed Care - PPO

## 2010-11-08 DIAGNOSIS — J309 Allergic rhinitis, unspecified: Secondary | ICD-10-CM

## 2010-11-17 ENCOUNTER — Ambulatory Visit (INDEPENDENT_AMBULATORY_CARE_PROVIDER_SITE_OTHER): Payer: Commercial Managed Care - PPO

## 2010-11-17 DIAGNOSIS — J309 Allergic rhinitis, unspecified: Secondary | ICD-10-CM

## 2010-11-25 ENCOUNTER — Ambulatory Visit (INDEPENDENT_AMBULATORY_CARE_PROVIDER_SITE_OTHER): Payer: Commercial Managed Care - PPO

## 2010-11-25 DIAGNOSIS — J309 Allergic rhinitis, unspecified: Secondary | ICD-10-CM

## 2010-12-02 ENCOUNTER — Ambulatory Visit (INDEPENDENT_AMBULATORY_CARE_PROVIDER_SITE_OTHER): Payer: Commercial Managed Care - PPO

## 2010-12-02 DIAGNOSIS — J309 Allergic rhinitis, unspecified: Secondary | ICD-10-CM

## 2010-12-09 ENCOUNTER — Ambulatory Visit (INDEPENDENT_AMBULATORY_CARE_PROVIDER_SITE_OTHER): Payer: Commercial Managed Care - PPO

## 2010-12-09 DIAGNOSIS — J309 Allergic rhinitis, unspecified: Secondary | ICD-10-CM

## 2010-12-13 ENCOUNTER — Encounter: Payer: Self-pay | Admitting: Internal Medicine

## 2010-12-16 ENCOUNTER — Ambulatory Visit: Payer: Commercial Managed Care - PPO | Admitting: Internal Medicine

## 2010-12-19 ENCOUNTER — Ambulatory Visit (INDEPENDENT_AMBULATORY_CARE_PROVIDER_SITE_OTHER): Payer: Commercial Managed Care - PPO

## 2010-12-19 ENCOUNTER — Encounter: Payer: Self-pay | Admitting: Gastroenterology

## 2010-12-19 DIAGNOSIS — J309 Allergic rhinitis, unspecified: Secondary | ICD-10-CM

## 2010-12-23 ENCOUNTER — Ambulatory Visit: Payer: Commercial Managed Care - PPO | Admitting: Internal Medicine

## 2010-12-26 ENCOUNTER — Ambulatory Visit (INDEPENDENT_AMBULATORY_CARE_PROVIDER_SITE_OTHER): Payer: Commercial Managed Care - PPO

## 2010-12-26 DIAGNOSIS — J309 Allergic rhinitis, unspecified: Secondary | ICD-10-CM

## 2011-01-06 ENCOUNTER — Ambulatory Visit (INDEPENDENT_AMBULATORY_CARE_PROVIDER_SITE_OTHER): Payer: Commercial Managed Care - PPO

## 2011-01-06 DIAGNOSIS — J309 Allergic rhinitis, unspecified: Secondary | ICD-10-CM

## 2011-01-13 ENCOUNTER — Ambulatory Visit (INDEPENDENT_AMBULATORY_CARE_PROVIDER_SITE_OTHER): Payer: Commercial Managed Care - PPO

## 2011-01-13 DIAGNOSIS — J309 Allergic rhinitis, unspecified: Secondary | ICD-10-CM

## 2011-01-18 ENCOUNTER — Ambulatory Visit (INDEPENDENT_AMBULATORY_CARE_PROVIDER_SITE_OTHER): Payer: Commercial Managed Care - PPO

## 2011-01-18 ENCOUNTER — Ambulatory Visit (AMBULATORY_SURGERY_CENTER): Payer: Commercial Managed Care - PPO | Admitting: *Deleted

## 2011-01-18 ENCOUNTER — Encounter: Payer: Self-pay | Admitting: Gastroenterology

## 2011-01-18 VITALS — Ht 68.0 in | Wt 181.9 lb

## 2011-01-18 DIAGNOSIS — Z1211 Encounter for screening for malignant neoplasm of colon: Secondary | ICD-10-CM

## 2011-01-18 DIAGNOSIS — J309 Allergic rhinitis, unspecified: Secondary | ICD-10-CM

## 2011-01-18 MED ORDER — MOVIPREP 100 G PO SOLR: ORAL | Status: DC

## 2011-02-03 ENCOUNTER — Ambulatory Visit (AMBULATORY_SURGERY_CENTER): Payer: Commercial Managed Care - PPO | Admitting: Gastroenterology

## 2011-02-03 ENCOUNTER — Encounter: Payer: Self-pay | Admitting: Gastroenterology

## 2011-02-03 VITALS — BP 125/84 | HR 80 | Temp 97.0°F | Resp 17 | Ht 68.0 in | Wt 181.0 lb

## 2011-02-03 DIAGNOSIS — Z1211 Encounter for screening for malignant neoplasm of colon: Secondary | ICD-10-CM

## 2011-02-03 MED ORDER — SODIUM CHLORIDE 0.9 % IV SOLN: 500.0000 mL | INTRAVENOUS | Status: DC

## 2011-02-03 NOTE — Patient Instructions (Signed)
Please refer to your blue and neon green sheets for instructions regarding diet and activity for the rest of today.  You may resume your medications as you would normally take them.   Diverticulosis Diverticulosis is a common condition that develops when small pouches (diverticula) form in the wall of the colon. The risk of diverticulosis increases with age. It happens more often in people who eat a low-fiber diet. Most individuals with diverticulosis have no symptoms. Those individuals with symptoms usually experience abdominal pain, constipation, or loose stools (diarrhea). HOME CARE INSTRUCTIONS   Increase the amount of fiber in your diet as directed by your caregiver or dietician. This may reduce symptoms of diverticulosis.   Your caregiver may recommend taking a dietary fiber supplement.   Drink at least 6 to 8 glasses of water each day to prevent constipation.   Try not to strain when you have a bowel movement.   Your caregiver may recommend avoiding nuts and seeds to prevent complications, although this is still an uncertain benefit.   Only take over-the-counter or prescription medicines for pain, discomfort, or fever as directed by your caregiver.  FOODS WITH HIGH FIBER CONTENT INCLUDE:  Fruits. Apple, peach, pear, tangerine, raisins, prunes.   Vegetables. Brussels sprouts, asparagus, broccoli, cabbage, carrot, cauliflower, romaine lettuce, spinach, summer squash, tomato, winter squash, zucchini.   Starchy Vegetables. Baked beans, kidney beans, lima beans, split peas, lentils, potatoes (with skin).   Grains. Whole wheat bread, brown rice, bran flake cereal, plain oatmeal, white rice, shredded wheat, bran muffins.  SEEK IMMEDIATE MEDICAL CARE IF:   You develop increasing pain or severe bloating.   You have an oral temperature above 102 F (38.9 C), not controlled by medicine.   You develop vomiting or bowel movements that are bloody or black.  Document Released: 12/02/2003  Document Revised: 11/16/2010 Document Reviewed: 08/04/2009 [employer] Patient Information 2012 [employer]. 

## 2011-02-06 ENCOUNTER — Ambulatory Visit (INDEPENDENT_AMBULATORY_CARE_PROVIDER_SITE_OTHER): Payer: Commercial Managed Care - PPO

## 2011-02-06 ENCOUNTER — Telehealth: Payer: Self-pay | Admitting: *Deleted

## 2011-02-06 DIAGNOSIS — J309 Allergic rhinitis, unspecified: Secondary | ICD-10-CM

## 2011-02-06 NOTE — Telephone Encounter (Signed)
No answer. Left message to call as needed.

## 2011-02-15 ENCOUNTER — Ambulatory Visit (INDEPENDENT_AMBULATORY_CARE_PROVIDER_SITE_OTHER): Payer: Commercial Managed Care - PPO

## 2011-02-15 DIAGNOSIS — J309 Allergic rhinitis, unspecified: Secondary | ICD-10-CM

## 2011-02-20 ENCOUNTER — Ambulatory Visit (INDEPENDENT_AMBULATORY_CARE_PROVIDER_SITE_OTHER): Payer: Commercial Managed Care - PPO

## 2011-02-20 DIAGNOSIS — J309 Allergic rhinitis, unspecified: Secondary | ICD-10-CM

## 2011-03-03 ENCOUNTER — Ambulatory Visit (INDEPENDENT_AMBULATORY_CARE_PROVIDER_SITE_OTHER): Payer: Commercial Managed Care - PPO

## 2011-03-03 DIAGNOSIS — J309 Allergic rhinitis, unspecified: Secondary | ICD-10-CM

## 2011-03-10 ENCOUNTER — Ambulatory Visit (INDEPENDENT_AMBULATORY_CARE_PROVIDER_SITE_OTHER): Payer: Commercial Managed Care - PPO

## 2011-03-10 DIAGNOSIS — J309 Allergic rhinitis, unspecified: Secondary | ICD-10-CM

## 2011-03-13 ENCOUNTER — Ambulatory Visit (INDEPENDENT_AMBULATORY_CARE_PROVIDER_SITE_OTHER): Payer: Commercial Managed Care - PPO

## 2011-03-13 DIAGNOSIS — J309 Allergic rhinitis, unspecified: Secondary | ICD-10-CM

## 2011-03-24 ENCOUNTER — Ambulatory Visit (INDEPENDENT_AMBULATORY_CARE_PROVIDER_SITE_OTHER): Payer: Commercial Managed Care - PPO

## 2011-03-24 DIAGNOSIS — J309 Allergic rhinitis, unspecified: Secondary | ICD-10-CM

## 2011-03-27 ENCOUNTER — Other Ambulatory Visit: Payer: Self-pay | Admitting: Internal Medicine

## 2011-04-03 ENCOUNTER — Ambulatory Visit (INDEPENDENT_AMBULATORY_CARE_PROVIDER_SITE_OTHER): Payer: Commercial Managed Care - PPO

## 2011-04-03 DIAGNOSIS — J309 Allergic rhinitis, unspecified: Secondary | ICD-10-CM

## 2011-04-05 ENCOUNTER — Encounter: Payer: Self-pay | Admitting: Internal Medicine

## 2011-04-12 ENCOUNTER — Ambulatory Visit (INDEPENDENT_AMBULATORY_CARE_PROVIDER_SITE_OTHER): Payer: Commercial Managed Care - PPO

## 2011-04-12 DIAGNOSIS — J309 Allergic rhinitis, unspecified: Secondary | ICD-10-CM

## 2011-04-21 ENCOUNTER — Ambulatory Visit (INDEPENDENT_AMBULATORY_CARE_PROVIDER_SITE_OTHER): Payer: Commercial Managed Care - PPO

## 2011-04-21 DIAGNOSIS — J309 Allergic rhinitis, unspecified: Secondary | ICD-10-CM

## 2011-05-05 ENCOUNTER — Ambulatory Visit (INDEPENDENT_AMBULATORY_CARE_PROVIDER_SITE_OTHER): Payer: Commercial Managed Care - PPO

## 2011-05-05 DIAGNOSIS — J309 Allergic rhinitis, unspecified: Secondary | ICD-10-CM

## 2011-05-15 ENCOUNTER — Ambulatory Visit (INDEPENDENT_AMBULATORY_CARE_PROVIDER_SITE_OTHER): Payer: Commercial Managed Care - PPO

## 2011-05-15 DIAGNOSIS — J309 Allergic rhinitis, unspecified: Secondary | ICD-10-CM

## 2011-05-26 ENCOUNTER — Ambulatory Visit (INDEPENDENT_AMBULATORY_CARE_PROVIDER_SITE_OTHER): Payer: Commercial Managed Care - PPO

## 2011-05-26 DIAGNOSIS — J309 Allergic rhinitis, unspecified: Secondary | ICD-10-CM

## 2011-06-05 ENCOUNTER — Ambulatory Visit (INDEPENDENT_AMBULATORY_CARE_PROVIDER_SITE_OTHER): Payer: Commercial Managed Care - PPO

## 2011-06-05 DIAGNOSIS — J309 Allergic rhinitis, unspecified: Secondary | ICD-10-CM

## 2011-06-12 ENCOUNTER — Ambulatory Visit (INDEPENDENT_AMBULATORY_CARE_PROVIDER_SITE_OTHER): Payer: Commercial Managed Care - PPO

## 2011-06-12 DIAGNOSIS — J309 Allergic rhinitis, unspecified: Secondary | ICD-10-CM

## 2011-06-23 ENCOUNTER — Ambulatory Visit (INDEPENDENT_AMBULATORY_CARE_PROVIDER_SITE_OTHER): Payer: Commercial Managed Care - PPO

## 2011-06-23 DIAGNOSIS — J309 Allergic rhinitis, unspecified: Secondary | ICD-10-CM

## 2011-07-03 ENCOUNTER — Ambulatory Visit (INDEPENDENT_AMBULATORY_CARE_PROVIDER_SITE_OTHER): Payer: Commercial Managed Care - PPO

## 2011-07-03 DIAGNOSIS — J309 Allergic rhinitis, unspecified: Secondary | ICD-10-CM

## 2011-07-10 ENCOUNTER — Ambulatory Visit (INDEPENDENT_AMBULATORY_CARE_PROVIDER_SITE_OTHER): Payer: Commercial Managed Care - PPO

## 2011-07-10 DIAGNOSIS — J309 Allergic rhinitis, unspecified: Secondary | ICD-10-CM

## 2011-07-17 ENCOUNTER — Ambulatory Visit (INDEPENDENT_AMBULATORY_CARE_PROVIDER_SITE_OTHER): Payer: Commercial Managed Care - PPO

## 2011-07-17 DIAGNOSIS — J309 Allergic rhinitis, unspecified: Secondary | ICD-10-CM

## 2011-07-26 ENCOUNTER — Ambulatory Visit (INDEPENDENT_AMBULATORY_CARE_PROVIDER_SITE_OTHER): Payer: Commercial Managed Care - PPO

## 2011-07-26 DIAGNOSIS — J309 Allergic rhinitis, unspecified: Secondary | ICD-10-CM

## 2011-08-01 ENCOUNTER — Ambulatory Visit (INDEPENDENT_AMBULATORY_CARE_PROVIDER_SITE_OTHER): Payer: Commercial Managed Care - PPO

## 2011-08-01 DIAGNOSIS — J309 Allergic rhinitis, unspecified: Secondary | ICD-10-CM

## 2011-08-11 ENCOUNTER — Ambulatory Visit (INDEPENDENT_AMBULATORY_CARE_PROVIDER_SITE_OTHER): Payer: Commercial Managed Care - PPO

## 2011-08-11 ENCOUNTER — Encounter: Payer: Self-pay | Admitting: Internal Medicine

## 2011-08-11 DIAGNOSIS — J309 Allergic rhinitis, unspecified: Secondary | ICD-10-CM

## 2011-08-21 ENCOUNTER — Ambulatory Visit (INDEPENDENT_AMBULATORY_CARE_PROVIDER_SITE_OTHER): Payer: Commercial Managed Care - PPO

## 2011-08-21 DIAGNOSIS — J309 Allergic rhinitis, unspecified: Secondary | ICD-10-CM

## 2011-08-28 ENCOUNTER — Ambulatory Visit (INDEPENDENT_AMBULATORY_CARE_PROVIDER_SITE_OTHER): Payer: Commercial Managed Care - PPO

## 2011-08-28 DIAGNOSIS — J309 Allergic rhinitis, unspecified: Secondary | ICD-10-CM

## 2011-08-30 ENCOUNTER — Ambulatory Visit (INDEPENDENT_AMBULATORY_CARE_PROVIDER_SITE_OTHER): Payer: Commercial Managed Care - PPO

## 2011-08-30 DIAGNOSIS — J309 Allergic rhinitis, unspecified: Secondary | ICD-10-CM

## 2011-09-04 ENCOUNTER — Ambulatory Visit (INDEPENDENT_AMBULATORY_CARE_PROVIDER_SITE_OTHER): Payer: Commercial Managed Care - PPO

## 2011-09-04 DIAGNOSIS — J309 Allergic rhinitis, unspecified: Secondary | ICD-10-CM

## 2011-09-11 ENCOUNTER — Ambulatory Visit (INDEPENDENT_AMBULATORY_CARE_PROVIDER_SITE_OTHER): Payer: Commercial Managed Care - PPO

## 2011-09-11 DIAGNOSIS — J309 Allergic rhinitis, unspecified: Secondary | ICD-10-CM

## 2011-09-19 ENCOUNTER — Ambulatory Visit (INDEPENDENT_AMBULATORY_CARE_PROVIDER_SITE_OTHER): Payer: Commercial Managed Care - PPO

## 2011-09-19 DIAGNOSIS — J309 Allergic rhinitis, unspecified: Secondary | ICD-10-CM

## 2011-09-25 ENCOUNTER — Ambulatory Visit (INDEPENDENT_AMBULATORY_CARE_PROVIDER_SITE_OTHER): Payer: Commercial Managed Care - PPO

## 2011-09-25 DIAGNOSIS — J309 Allergic rhinitis, unspecified: Secondary | ICD-10-CM

## 2011-10-04 ENCOUNTER — Ambulatory Visit (INDEPENDENT_AMBULATORY_CARE_PROVIDER_SITE_OTHER): Payer: Commercial Managed Care - PPO

## 2011-10-04 DIAGNOSIS — J309 Allergic rhinitis, unspecified: Secondary | ICD-10-CM

## 2011-10-10 ENCOUNTER — Ambulatory Visit (INDEPENDENT_AMBULATORY_CARE_PROVIDER_SITE_OTHER): Payer: Commercial Managed Care - PPO

## 2011-10-10 DIAGNOSIS — J309 Allergic rhinitis, unspecified: Secondary | ICD-10-CM

## 2011-10-20 ENCOUNTER — Ambulatory Visit (INDEPENDENT_AMBULATORY_CARE_PROVIDER_SITE_OTHER): Payer: Commercial Managed Care - PPO

## 2011-10-20 DIAGNOSIS — J309 Allergic rhinitis, unspecified: Secondary | ICD-10-CM

## 2011-10-30 ENCOUNTER — Ambulatory Visit (INDEPENDENT_AMBULATORY_CARE_PROVIDER_SITE_OTHER): Payer: Commercial Managed Care - PPO

## 2011-10-30 DIAGNOSIS — J309 Allergic rhinitis, unspecified: Secondary | ICD-10-CM

## 2011-11-08 ENCOUNTER — Ambulatory Visit (INDEPENDENT_AMBULATORY_CARE_PROVIDER_SITE_OTHER): Payer: Commercial Managed Care - PPO

## 2011-11-08 DIAGNOSIS — J309 Allergic rhinitis, unspecified: Secondary | ICD-10-CM

## 2011-11-13 ENCOUNTER — Ambulatory Visit (INDEPENDENT_AMBULATORY_CARE_PROVIDER_SITE_OTHER): Payer: Commercial Managed Care - PPO

## 2011-11-13 DIAGNOSIS — J309 Allergic rhinitis, unspecified: Secondary | ICD-10-CM

## 2011-11-22 ENCOUNTER — Encounter: Payer: Self-pay | Admitting: Internal Medicine

## 2011-12-04 ENCOUNTER — Ambulatory Visit (INDEPENDENT_AMBULATORY_CARE_PROVIDER_SITE_OTHER): Payer: BC Managed Care – PPO

## 2011-12-04 DIAGNOSIS — J309 Allergic rhinitis, unspecified: Secondary | ICD-10-CM

## 2011-12-14 ENCOUNTER — Ambulatory Visit (INDEPENDENT_AMBULATORY_CARE_PROVIDER_SITE_OTHER): Payer: BC Managed Care – PPO

## 2011-12-14 DIAGNOSIS — J309 Allergic rhinitis, unspecified: Secondary | ICD-10-CM

## 2011-12-22 ENCOUNTER — Ambulatory Visit (INDEPENDENT_AMBULATORY_CARE_PROVIDER_SITE_OTHER): Payer: BC Managed Care – PPO

## 2011-12-22 DIAGNOSIS — J309 Allergic rhinitis, unspecified: Secondary | ICD-10-CM

## 2011-12-27 ENCOUNTER — Ambulatory Visit (INDEPENDENT_AMBULATORY_CARE_PROVIDER_SITE_OTHER): Payer: BC Managed Care – PPO

## 2011-12-27 DIAGNOSIS — J309 Allergic rhinitis, unspecified: Secondary | ICD-10-CM

## 2012-01-05 ENCOUNTER — Ambulatory Visit (INDEPENDENT_AMBULATORY_CARE_PROVIDER_SITE_OTHER): Payer: BC Managed Care – PPO

## 2012-01-05 DIAGNOSIS — J309 Allergic rhinitis, unspecified: Secondary | ICD-10-CM

## 2012-01-12 ENCOUNTER — Ambulatory Visit (INDEPENDENT_AMBULATORY_CARE_PROVIDER_SITE_OTHER): Payer: BC Managed Care – PPO

## 2012-01-12 DIAGNOSIS — J309 Allergic rhinitis, unspecified: Secondary | ICD-10-CM

## 2012-01-19 ENCOUNTER — Ambulatory Visit (INDEPENDENT_AMBULATORY_CARE_PROVIDER_SITE_OTHER): Payer: BC Managed Care – PPO

## 2012-01-19 DIAGNOSIS — J309 Allergic rhinitis, unspecified: Secondary | ICD-10-CM

## 2012-01-22 ENCOUNTER — Ambulatory Visit (INDEPENDENT_AMBULATORY_CARE_PROVIDER_SITE_OTHER): Payer: BC Managed Care – PPO

## 2012-01-22 DIAGNOSIS — J309 Allergic rhinitis, unspecified: Secondary | ICD-10-CM

## 2012-01-23 ENCOUNTER — Other Ambulatory Visit: Payer: Self-pay | Admitting: Internal Medicine

## 2012-01-23 DIAGNOSIS — R599 Enlarged lymph nodes, unspecified: Secondary | ICD-10-CM

## 2012-01-24 ENCOUNTER — Ambulatory Visit
Admission: RE | Admit: 2012-01-24 | Discharge: 2012-01-24 | Disposition: A | Discharge status: Home / Self Care | Payer: BC Managed Care – PPO | Source: Ambulatory Visit | Attending: Internal Medicine | Admitting: Internal Medicine

## 2012-01-24 DIAGNOSIS — R599 Enlarged lymph nodes, unspecified: Secondary | ICD-10-CM

## 2012-01-24 MED ORDER — IOHEXOL 300 MG/ML  SOLN
75.0000 mL | Freq: Once | INTRAMUSCULAR | Status: AC | PRN
  Administered 2012-01-24: 75 mL via INTRAVENOUS

## 2012-02-02 ENCOUNTER — Ambulatory Visit (INDEPENDENT_AMBULATORY_CARE_PROVIDER_SITE_OTHER): Payer: BC Managed Care – PPO

## 2012-02-02 DIAGNOSIS — J309 Allergic rhinitis, unspecified: Secondary | ICD-10-CM

## 2012-02-19 ENCOUNTER — Ambulatory Visit (INDEPENDENT_AMBULATORY_CARE_PROVIDER_SITE_OTHER): Payer: BC Managed Care – PPO

## 2012-02-19 DIAGNOSIS — J309 Allergic rhinitis, unspecified: Secondary | ICD-10-CM

## 2012-02-29 ENCOUNTER — Ambulatory Visit (INDEPENDENT_AMBULATORY_CARE_PROVIDER_SITE_OTHER): Payer: BC Managed Care – PPO

## 2012-02-29 DIAGNOSIS — J309 Allergic rhinitis, unspecified: Secondary | ICD-10-CM

## 2012-03-11 ENCOUNTER — Ambulatory Visit (INDEPENDENT_AMBULATORY_CARE_PROVIDER_SITE_OTHER): Payer: BC Managed Care – PPO

## 2012-03-11 DIAGNOSIS — J309 Allergic rhinitis, unspecified: Secondary | ICD-10-CM

## 2012-03-21 ENCOUNTER — Ambulatory Visit (INDEPENDENT_AMBULATORY_CARE_PROVIDER_SITE_OTHER): Payer: BC Managed Care – PPO

## 2012-03-21 DIAGNOSIS — J309 Allergic rhinitis, unspecified: Secondary | ICD-10-CM

## 2012-04-05 ENCOUNTER — Ambulatory Visit (INDEPENDENT_AMBULATORY_CARE_PROVIDER_SITE_OTHER): Payer: BC Managed Care – PPO

## 2012-04-05 DIAGNOSIS — J309 Allergic rhinitis, unspecified: Secondary | ICD-10-CM

## 2012-04-12 ENCOUNTER — Ambulatory Visit (INDEPENDENT_AMBULATORY_CARE_PROVIDER_SITE_OTHER): Payer: BC Managed Care – PPO

## 2012-04-12 DIAGNOSIS — J309 Allergic rhinitis, unspecified: Secondary | ICD-10-CM

## 2012-04-19 ENCOUNTER — Ambulatory Visit: Payer: BC Managed Care – PPO

## 2012-04-23 ENCOUNTER — Encounter: Payer: Self-pay | Admitting: Internal Medicine

## 2012-04-26 ENCOUNTER — Ambulatory Visit (INDEPENDENT_AMBULATORY_CARE_PROVIDER_SITE_OTHER): Payer: BC Managed Care – PPO

## 2012-04-26 DIAGNOSIS — J309 Allergic rhinitis, unspecified: Secondary | ICD-10-CM

## 2012-05-10 ENCOUNTER — Ambulatory Visit (INDEPENDENT_AMBULATORY_CARE_PROVIDER_SITE_OTHER): Payer: BC Managed Care – PPO

## 2012-05-10 DIAGNOSIS — J309 Allergic rhinitis, unspecified: Secondary | ICD-10-CM

## 2012-05-17 ENCOUNTER — Ambulatory Visit (INDEPENDENT_AMBULATORY_CARE_PROVIDER_SITE_OTHER): Payer: BC Managed Care – PPO

## 2012-05-31 ENCOUNTER — Ambulatory Visit (INDEPENDENT_AMBULATORY_CARE_PROVIDER_SITE_OTHER): Payer: BC Managed Care – PPO

## 2012-05-31 DIAGNOSIS — J309 Allergic rhinitis, unspecified: Secondary | ICD-10-CM

## 2012-06-14 ENCOUNTER — Ambulatory Visit (INDEPENDENT_AMBULATORY_CARE_PROVIDER_SITE_OTHER): Payer: BC Managed Care – PPO

## 2012-06-14 DIAGNOSIS — J309 Allergic rhinitis, unspecified: Secondary | ICD-10-CM

## 2012-06-19 ENCOUNTER — Ambulatory Visit (INDEPENDENT_AMBULATORY_CARE_PROVIDER_SITE_OTHER): Payer: BC Managed Care – PPO

## 2012-06-19 DIAGNOSIS — J309 Allergic rhinitis, unspecified: Secondary | ICD-10-CM

## 2012-06-28 ENCOUNTER — Ambulatory Visit: Payer: BC Managed Care – PPO

## 2012-07-12 ENCOUNTER — Ambulatory Visit (INDEPENDENT_AMBULATORY_CARE_PROVIDER_SITE_OTHER): Payer: BC Managed Care – PPO

## 2012-07-12 DIAGNOSIS — J309 Allergic rhinitis, unspecified: Secondary | ICD-10-CM

## 2012-07-19 ENCOUNTER — Ambulatory Visit (INDEPENDENT_AMBULATORY_CARE_PROVIDER_SITE_OTHER): Payer: BC Managed Care – PPO

## 2012-07-19 DIAGNOSIS — J309 Allergic rhinitis, unspecified: Secondary | ICD-10-CM

## 2012-07-26 ENCOUNTER — Ambulatory Visit (INDEPENDENT_AMBULATORY_CARE_PROVIDER_SITE_OTHER): Payer: BC Managed Care – PPO

## 2012-07-26 DIAGNOSIS — J309 Allergic rhinitis, unspecified: Secondary | ICD-10-CM

## 2012-08-02 ENCOUNTER — Ambulatory Visit (INDEPENDENT_AMBULATORY_CARE_PROVIDER_SITE_OTHER): Payer: BC Managed Care – PPO

## 2012-08-02 DIAGNOSIS — J309 Allergic rhinitis, unspecified: Secondary | ICD-10-CM

## 2012-08-07 ENCOUNTER — Ambulatory Visit (INDEPENDENT_AMBULATORY_CARE_PROVIDER_SITE_OTHER): Payer: BC Managed Care – PPO

## 2012-08-07 DIAGNOSIS — J309 Allergic rhinitis, unspecified: Secondary | ICD-10-CM

## 2012-08-09 ENCOUNTER — Ambulatory Visit (INDEPENDENT_AMBULATORY_CARE_PROVIDER_SITE_OTHER): Payer: BC Managed Care – PPO

## 2012-08-09 DIAGNOSIS — J309 Allergic rhinitis, unspecified: Secondary | ICD-10-CM

## 2012-08-16 ENCOUNTER — Ambulatory Visit (INDEPENDENT_AMBULATORY_CARE_PROVIDER_SITE_OTHER): Payer: BC Managed Care – PPO

## 2012-08-16 DIAGNOSIS — J309 Allergic rhinitis, unspecified: Secondary | ICD-10-CM

## 2012-08-23 ENCOUNTER — Ambulatory Visit (INDEPENDENT_AMBULATORY_CARE_PROVIDER_SITE_OTHER): Payer: BC Managed Care – PPO

## 2012-08-23 DIAGNOSIS — J309 Allergic rhinitis, unspecified: Secondary | ICD-10-CM

## 2012-08-29 ENCOUNTER — Ambulatory Visit (INDEPENDENT_AMBULATORY_CARE_PROVIDER_SITE_OTHER): Payer: BC Managed Care – PPO

## 2012-08-29 DIAGNOSIS — J309 Allergic rhinitis, unspecified: Secondary | ICD-10-CM

## 2012-08-30 ENCOUNTER — Ambulatory Visit: Payer: BC Managed Care – PPO

## 2012-09-06 ENCOUNTER — Ambulatory Visit (INDEPENDENT_AMBULATORY_CARE_PROVIDER_SITE_OTHER): Payer: BC Managed Care – PPO

## 2012-09-06 DIAGNOSIS — J309 Allergic rhinitis, unspecified: Secondary | ICD-10-CM

## 2012-09-13 ENCOUNTER — Ambulatory Visit: Payer: BC Managed Care – PPO

## 2012-09-27 ENCOUNTER — Ambulatory Visit (INDEPENDENT_AMBULATORY_CARE_PROVIDER_SITE_OTHER): Payer: BC Managed Care – PPO

## 2012-09-27 DIAGNOSIS — J309 Allergic rhinitis, unspecified: Secondary | ICD-10-CM

## 2012-10-04 ENCOUNTER — Ambulatory Visit (INDEPENDENT_AMBULATORY_CARE_PROVIDER_SITE_OTHER): Payer: BC Managed Care – PPO

## 2012-10-04 DIAGNOSIS — J309 Allergic rhinitis, unspecified: Secondary | ICD-10-CM

## 2012-10-11 ENCOUNTER — Ambulatory Visit: Payer: BC Managed Care – PPO

## 2012-10-18 ENCOUNTER — Ambulatory Visit (INDEPENDENT_AMBULATORY_CARE_PROVIDER_SITE_OTHER): Payer: BC Managed Care – PPO

## 2012-10-18 DIAGNOSIS — J309 Allergic rhinitis, unspecified: Secondary | ICD-10-CM

## 2012-10-24 ENCOUNTER — Other Ambulatory Visit: Payer: Self-pay | Admitting: Internal Medicine

## 2012-10-24 ENCOUNTER — Ambulatory Visit (INDEPENDENT_AMBULATORY_CARE_PROVIDER_SITE_OTHER): Payer: BC Managed Care – PPO

## 2012-10-24 DIAGNOSIS — J309 Allergic rhinitis, unspecified: Secondary | ICD-10-CM

## 2012-10-24 MED ORDER — EPINEPHRINE 0.3 MG/0.3ML IJ SOAJ: 0.3000 mg | Freq: Once | INTRAMUSCULAR | Status: DC

## 2012-10-25 ENCOUNTER — Ambulatory Visit: Payer: BC Managed Care – PPO

## 2012-11-01 ENCOUNTER — Ambulatory Visit: Payer: BC Managed Care – PPO

## 2012-11-07 ENCOUNTER — Ambulatory Visit (INDEPENDENT_AMBULATORY_CARE_PROVIDER_SITE_OTHER): Payer: BC Managed Care – PPO

## 2012-11-07 DIAGNOSIS — J309 Allergic rhinitis, unspecified: Secondary | ICD-10-CM

## 2012-11-13 LAB — HEPATIC FUNCTION PANEL
ALT: 26 U/L (ref 5–41)
AST: 22 U/L (ref <40)
Albumin/Globulin Ratio: 1.6 — ABNORMAL LOW (ref 1.7–2.5)
Albumin: 4.2 g/dL (ref 3.5–5.2)
Alkaline Phosphatase: 102 U/L (ref 40–129)
Bilirubin, Direct: 0.09 mg/dL (ref <0.31)
Bilirubin, Indirect: 0.31 mg/dL (ref 0.00–1.00)
Total Bilirubin: 0.4 mg/dL (ref 0.3–1.2)
Total Protein: 6.8 g/dL (ref 6.4–8.3)

## 2012-11-13 LAB — ELECTROLYTE PANEL
Anion Gap: 11 mmol/L (ref 8–16)
CO2: 28 mmol/L (ref 20–31)
Chloride: 105 mmol/L (ref 98–107)
Potassium: 4.3 mmol/L (ref 3.5–5.1)
Sodium: 140 mmol/L (ref 133–142)

## 2012-11-13 LAB — CBC WITH AUTO DIFFERENTIAL
Absolute Eos #: 0.3 10*3/uL (ref 0.0–0.4)
Absolute Lymph #: 2.8 10*3/uL (ref 1.0–4.8)
Absolute Mono #: 0.7 10*3/uL (ref 0.1–1.2)
Basophils Absolute: 0.1 10*3/uL (ref 0.0–0.2)
Basophils: 1 % (ref 0–2)
Eosinophils %: 3 % (ref 1–4)
Hematocrit: 41.5 % (ref 41–53)
Hemoglobin: 14.2 g/dL (ref 13.5–17.5)
Lymphocytes: 33 % (ref 24–44)
MCH: 30.4 pg (ref 26–34)
MCHC: 34.2 g/dL (ref 31–37)
MCV: 88.8 fL (ref 80–100)
MPV: 8.8 fL (ref 6.0–12.0)
Monocytes: 8 % (ref 2–11)
Platelets: 284 10*3/uL (ref 140–450)
RBC: 4.68 m/uL (ref 4.5–5.9)
RDW: 13.3 % (ref 12.5–15.4)
Seg Neutrophils: 55 % (ref 36–66)
Segs Absolute: 4.8 10*3/uL (ref 1.8–7.7)
WBC: 8.6 10*3/uL (ref 3.5–11.0)

## 2012-11-13 LAB — LIPID PANEL
Chol/HDL Ratio: 3.7 (ref <5)
Cholesterol: 141 mg/dL (ref <200)
HDL: 38 mg/dL — ABNORMAL LOW (ref >40)
LDL Cholesterol: 77 mg/dL (ref 0–130)
Triglycerides: 131 mg/dL (ref <150)

## 2012-11-13 LAB — CREATININE
Creatinine: 0.81 mg/dL (ref 0.70–1.20)
GFR African American: >60 mL/min (ref >60)
GFR Non-African American: >60 mL/min (ref >60)

## 2012-11-13 LAB — GLUCOSE, RANDOM: Glucose: 83 mg/dL (ref 70–99)

## 2012-11-13 LAB — TSH: TSH: 1.62 mIU/L (ref 0.30–5.00)

## 2012-11-13 LAB — PSA SCREENING: PSA: 0.86 ug/L (ref <4.1)

## 2012-11-13 LAB — BUN: BUN: 13 mg/dL (ref 8–23)

## 2012-11-13 LAB — T4, FREE: Thyroxine, Free: 1.13 ng/dL (ref 0.93–1.70)

## 2012-11-15 ENCOUNTER — Ambulatory Visit (INDEPENDENT_AMBULATORY_CARE_PROVIDER_SITE_OTHER): Payer: BC Managed Care – PPO

## 2012-11-15 DIAGNOSIS — J309 Allergic rhinitis, unspecified: Secondary | ICD-10-CM

## 2012-11-22 ENCOUNTER — Ambulatory Visit: Payer: BC Managed Care – PPO

## 2012-11-29 ENCOUNTER — Ambulatory Visit (INDEPENDENT_AMBULATORY_CARE_PROVIDER_SITE_OTHER): Payer: BC Managed Care – PPO

## 2012-11-29 DIAGNOSIS — J309 Allergic rhinitis, unspecified: Secondary | ICD-10-CM

## 2012-12-04 ENCOUNTER — Ambulatory Visit (INDEPENDENT_AMBULATORY_CARE_PROVIDER_SITE_OTHER): Payer: BC Managed Care – PPO

## 2012-12-04 DIAGNOSIS — J309 Allergic rhinitis, unspecified: Secondary | ICD-10-CM

## 2012-12-06 ENCOUNTER — Ambulatory Visit: Payer: BC Managed Care – PPO

## 2012-12-11 ENCOUNTER — Ambulatory Visit (INDEPENDENT_AMBULATORY_CARE_PROVIDER_SITE_OTHER): Payer: BC Managed Care – PPO

## 2012-12-11 DIAGNOSIS — J309 Allergic rhinitis, unspecified: Secondary | ICD-10-CM

## 2012-12-18 ENCOUNTER — Ambulatory Visit: Payer: BC Managed Care – PPO

## 2012-12-30 ENCOUNTER — Ambulatory Visit (INDEPENDENT_AMBULATORY_CARE_PROVIDER_SITE_OTHER): Payer: BC Managed Care – PPO

## 2012-12-30 DIAGNOSIS — J309 Allergic rhinitis, unspecified: Secondary | ICD-10-CM

## 2013-01-06 ENCOUNTER — Ambulatory Visit: Payer: BC Managed Care – PPO

## 2013-01-10 ENCOUNTER — Ambulatory Visit (INDEPENDENT_AMBULATORY_CARE_PROVIDER_SITE_OTHER): Payer: BC Managed Care – PPO

## 2013-01-10 DIAGNOSIS — J309 Allergic rhinitis, unspecified: Secondary | ICD-10-CM

## 2013-01-14 ENCOUNTER — Ambulatory Visit (INDEPENDENT_AMBULATORY_CARE_PROVIDER_SITE_OTHER): Payer: BC Managed Care – PPO

## 2013-01-14 DIAGNOSIS — J309 Allergic rhinitis, unspecified: Secondary | ICD-10-CM

## 2013-01-17 ENCOUNTER — Ambulatory Visit (INDEPENDENT_AMBULATORY_CARE_PROVIDER_SITE_OTHER): Payer: BC Managed Care – PPO

## 2013-01-17 DIAGNOSIS — J309 Allergic rhinitis, unspecified: Secondary | ICD-10-CM

## 2013-01-20 ENCOUNTER — Ambulatory Visit (INDEPENDENT_AMBULATORY_CARE_PROVIDER_SITE_OTHER): Payer: BC Managed Care – PPO | Admitting: Internal Medicine

## 2013-01-20 ENCOUNTER — Encounter: Payer: Self-pay | Admitting: Internal Medicine

## 2013-01-20 VITALS — BP 120/68 | HR 80 | Ht 68.0 in | Wt 173.2 lb

## 2013-01-20 DIAGNOSIS — G4733 Obstructive sleep apnea (adult) (pediatric): Secondary | ICD-10-CM

## 2013-01-20 DIAGNOSIS — J301 Allergic rhinitis due to pollen: Secondary | ICD-10-CM

## 2013-01-20 MED ORDER — EPINEPHRINE 0.3 MG/0.3ML IJ SOAJ: 0.3000 mg | Freq: Once | INTRAMUSCULAR | Status: AC

## 2013-01-20 NOTE — Progress Notes (Signed)
01/20/13- 62 yoM followed for Allergic rhinitis, hx OSA FOLLOWS FOR: last seen 2012; still on allergy vaccine 1:10 GH and doing well. He chose not to have septoplasty/Dr. Haroldine Laws. Gets flu vaccine through his primary physician, Dr. Clelia Croft. Spring pollen symptoms are still worse than fall. He supplements with occasional antihistamines in the spring and uses Flonase for intervals as needed. He continues CPAP 8/[facility] all night every night. Machine is old.   ROS-see HPI Constitutional:   No-   weight loss, night sweats, fevers, chills, fatigue, lassitude. HEENT:   No-  headaches, difficulty swallowing, tooth/dental problems, sore throat,       No-  sneezing, itching, ear ache, nasal congestion, post nasal drip,  CV:  No-   chest pain, orthopnea, PND, swelling in lower extremities, anasarca, dizziness, palpitations Resp: No-   shortness of breath with exertion or at rest.              No-   productive cough,  No non-productive cough,  No- coughing up of blood.              No-   change in color of mucus.  No- wheezing.   Skin: No-   rash or lesions. GI:  No-   heartburn, indigestion, abdominal pain, nausea, vomiting,  GU:  MS:  No-   joint pain or swelling.   Neuro-     nothing unusual Psych:  No- change in mood or affect. No depression or anxiety.  No memory loss.  OBJ- Physical Exam General- Alert, Oriented, Affect-appropriate, Distress- none acute Skin- rash-none, lesions- none, excoriation- none Lymphadenopathy- none Head- atraumatic            Eyes- Gross vision intact, PERRLA, conjunctivae and secretions clear            Ears- Hearing, canals-normal            Nose-  no-Septal dev, +mucus bridging, no-polyps, erosion, perforation             Throat- Mallampati III , mucosa clear , drainage- none, tonsils- atrophic Neck- flexible , trachea midline, no stridor , thyroid nl, carotid no bruit Chest - symmetrical excursion , unlabored           Heart/CV- RRR , no murmur , no gallop  , no  rub, nl s1 s2                           - JVD- none , edema- none, stasis changes- none, varices- none           Lung- clear to P&A, wheeze- none, cough- none , dullness-none, rub- none           Chest wall-  Abd-  Br/ Gen/ Rectal- Not done, not indicated Extrem- cyanosis- none, clubbing, none, atrophy- none, strength- nl Neuro- grossly intact to observation

## 2013-01-20 NOTE — Patient Instructions (Signed)
We will ask [facility] what they list as your CPAP pressure setting, for our records  Ok to continue allergy vaccine 1:10 GH and to supplement with an otc antihistamine and with fluticasone/ Flonase nasal spray when needed

## 2013-01-24 ENCOUNTER — Ambulatory Visit (INDEPENDENT_AMBULATORY_CARE_PROVIDER_SITE_OTHER): Payer: BC Managed Care – PPO

## 2013-01-24 DIAGNOSIS — J309 Allergic rhinitis, unspecified: Secondary | ICD-10-CM

## 2013-01-30 ENCOUNTER — Ambulatory Visit (INDEPENDENT_AMBULATORY_CARE_PROVIDER_SITE_OTHER): Payer: BC Managed Care – PPO

## 2013-01-30 DIAGNOSIS — J309 Allergic rhinitis, unspecified: Secondary | ICD-10-CM

## 2013-01-31 ENCOUNTER — Ambulatory Visit: Payer: BC Managed Care – PPO

## 2013-02-02 NOTE — Assessment & Plan Note (Signed)
We discussed criteria for replacement of old CPAP machine and he will review the issue with his DME company.

## 2013-02-02 NOTE — Assessment & Plan Note (Signed)
He says his allergy shots definitely help. He needs only occasional supplemental medication

## 2013-02-07 ENCOUNTER — Ambulatory Visit (INDEPENDENT_AMBULATORY_CARE_PROVIDER_SITE_OTHER): Payer: BC Managed Care – PPO

## 2013-02-07 DIAGNOSIS — J309 Allergic rhinitis, unspecified: Secondary | ICD-10-CM

## 2013-02-12 ENCOUNTER — Ambulatory Visit (INDEPENDENT_AMBULATORY_CARE_PROVIDER_SITE_OTHER): Payer: BC Managed Care – PPO

## 2013-02-12 DIAGNOSIS — J309 Allergic rhinitis, unspecified: Secondary | ICD-10-CM

## 2013-02-14 ENCOUNTER — Ambulatory Visit: Payer: BC Managed Care – PPO

## 2013-02-21 ENCOUNTER — Ambulatory Visit (INDEPENDENT_AMBULATORY_CARE_PROVIDER_SITE_OTHER): Payer: BC Managed Care – PPO

## 2013-02-21 DIAGNOSIS — J309 Allergic rhinitis, unspecified: Secondary | ICD-10-CM

## 2013-02-28 ENCOUNTER — Ambulatory Visit (INDEPENDENT_AMBULATORY_CARE_PROVIDER_SITE_OTHER): Payer: BC Managed Care – PPO

## 2013-02-28 DIAGNOSIS — J309 Allergic rhinitis, unspecified: Secondary | ICD-10-CM

## 2013-03-07 ENCOUNTER — Ambulatory Visit: Payer: BC Managed Care – PPO

## 2013-03-11 ENCOUNTER — Ambulatory Visit (INDEPENDENT_AMBULATORY_CARE_PROVIDER_SITE_OTHER): Payer: BC Managed Care – PPO

## 2013-03-11 DIAGNOSIS — J309 Allergic rhinitis, unspecified: Secondary | ICD-10-CM

## 2013-03-18 IMAGING — CT CT NECK W/ CM
4 of 6 series · 15 of 33 positions shown, 17 images · IV contrast (75CC OMNI 300)
Comparison: None.

CLINICAL DATA: Left cervical adenopathy.

CT NECK WITH CONTRAST
TECHNIQUE: Multidetector CT imaging of the neck was performed with
intravenous contrast.
Contrast: 75mL OMNIPAQUE IOHEXOL 300 MG/ML  SOLN

[Series 2: axial neck · axial · 0.39mm/px · z∈[+92,+237]mm · 4 of 98 slices shown]
[im 20/98  bone]
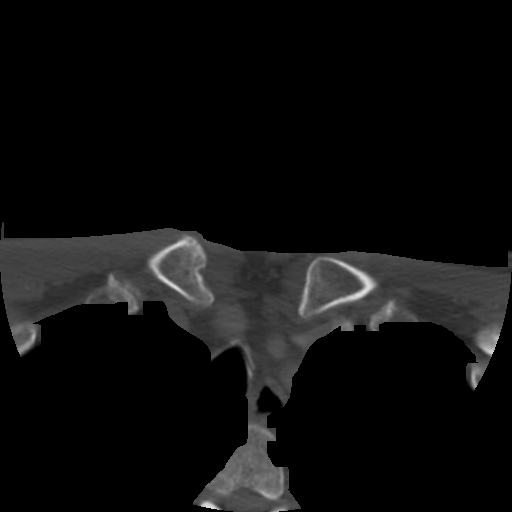
[im 39/98  bone]
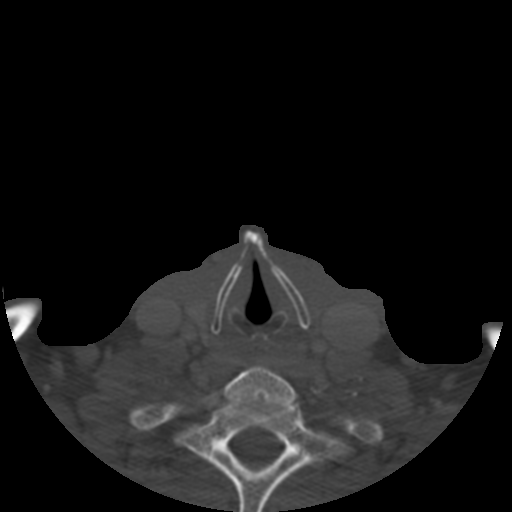
[im 59/98  bone]
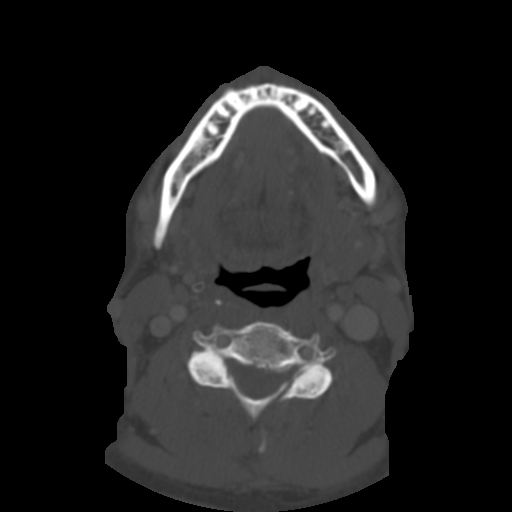
[im 78/98  bone]
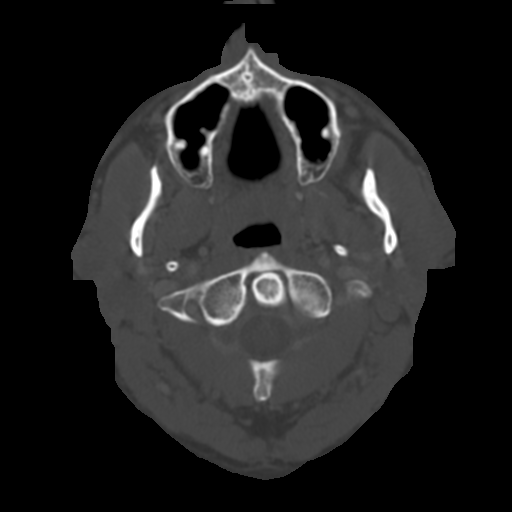

[Series 400: angle to hyoid · axial · 0.49mm/px · z∈[+82,+202]mm · 3 of 101 slices shown, 4 images]
[im 26/101  soft-tissue]
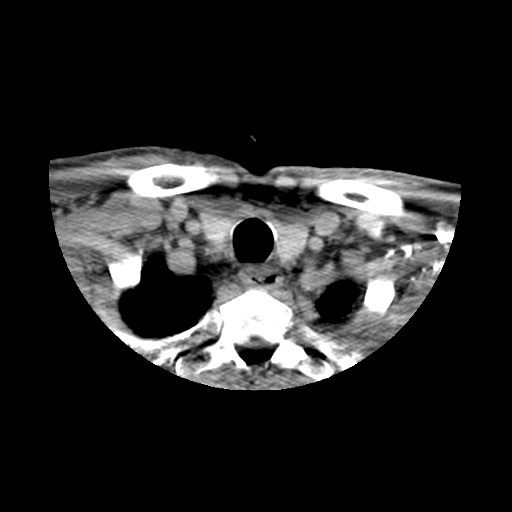
[im 26/101  bone]
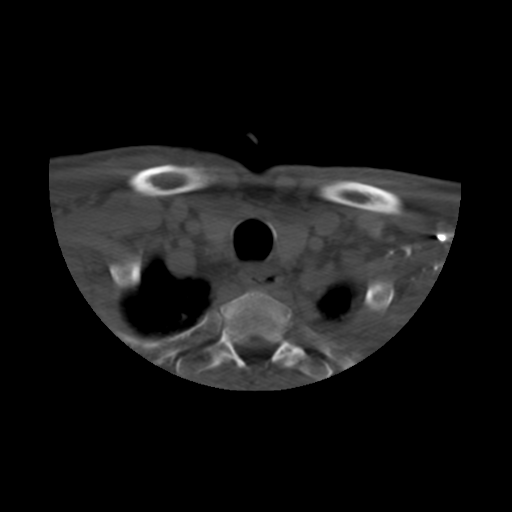
[im 51/101  bone]
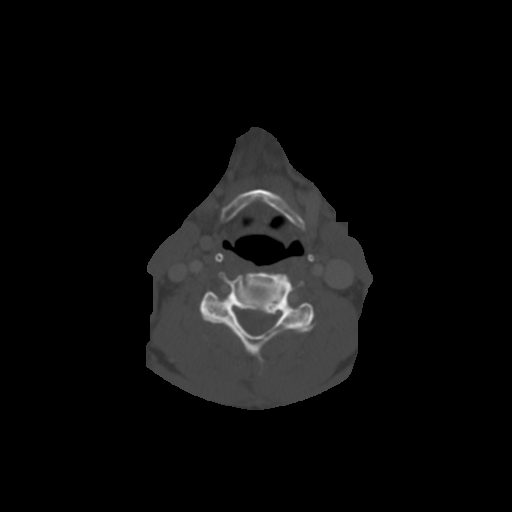
[im 76/101  bone]
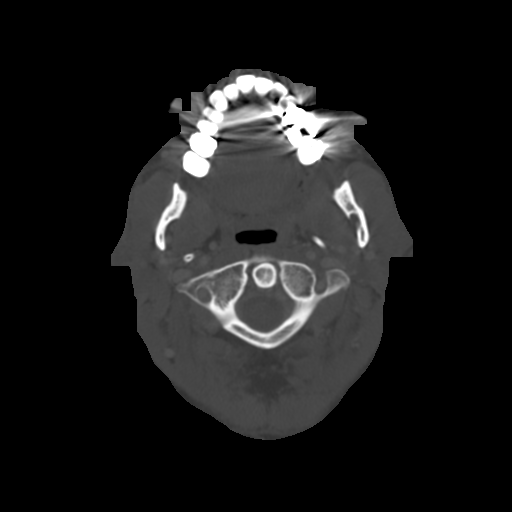

[Series 402: coronal · coronal · 0.49mm/px · 3 of 76 slices shown]
[im 16/76  bone]
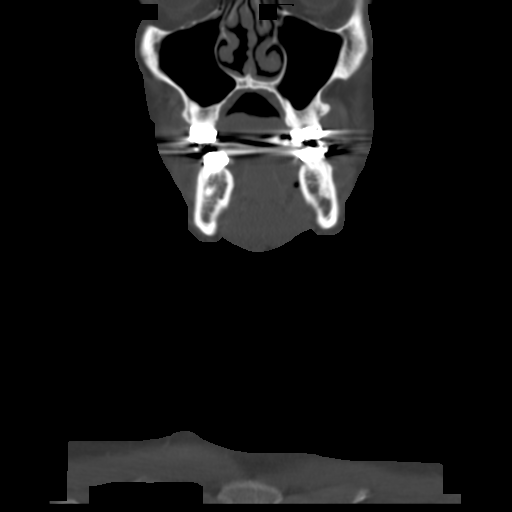
[im 31/76  bone]
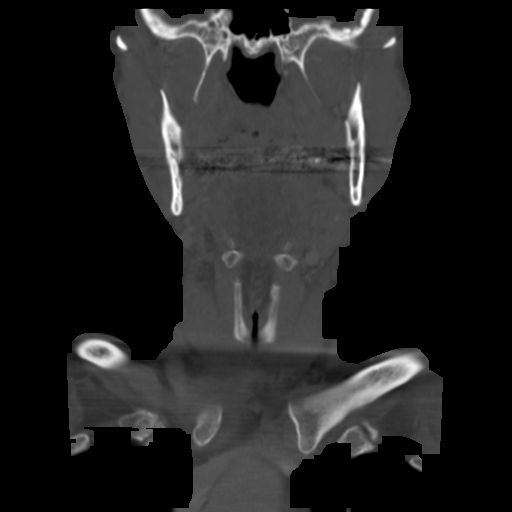
[im 46/76  bone]
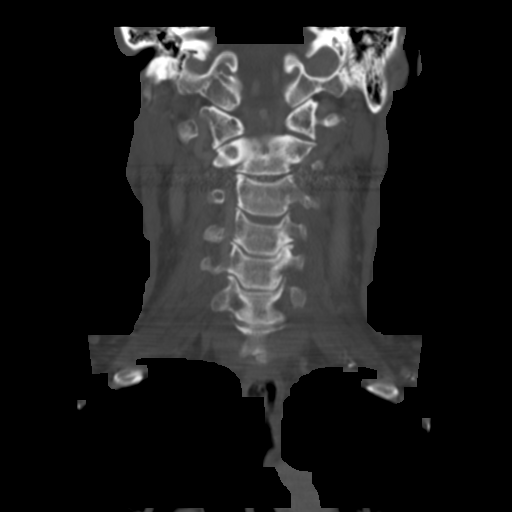

[Series 403: sagittal · sagittal · 0.49mm/px · 5 of 82 slices shown, 6 images]
[im 28/82  bone]
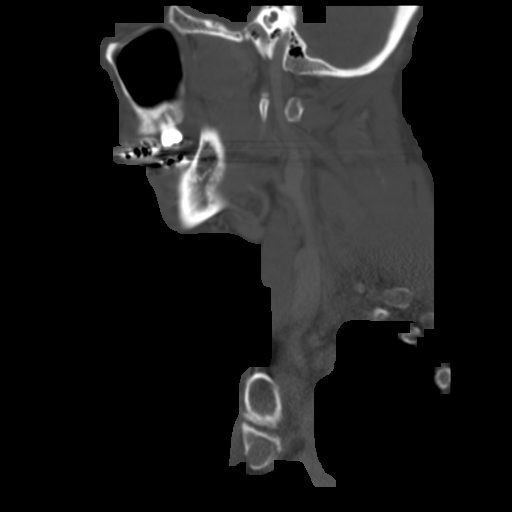
[im 34/82  bone]
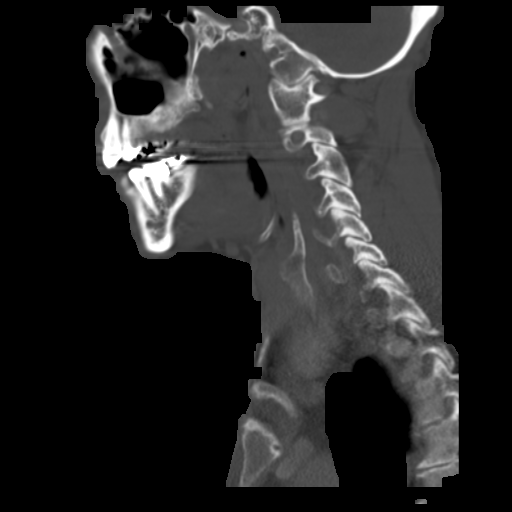
[im 41/82  soft-tissue]
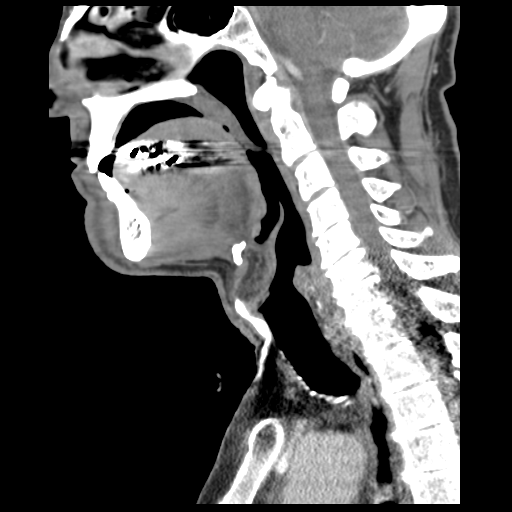
[im 41/82  bone]
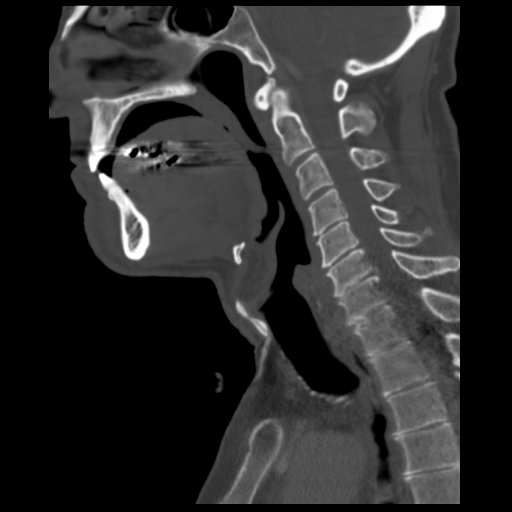
[im 48/82  bone]
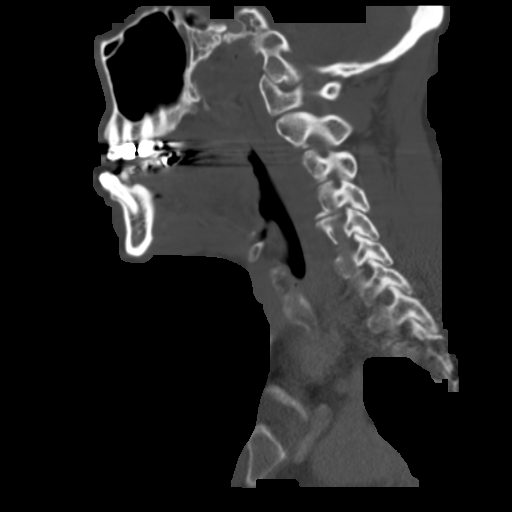
[im 55/82  bone]
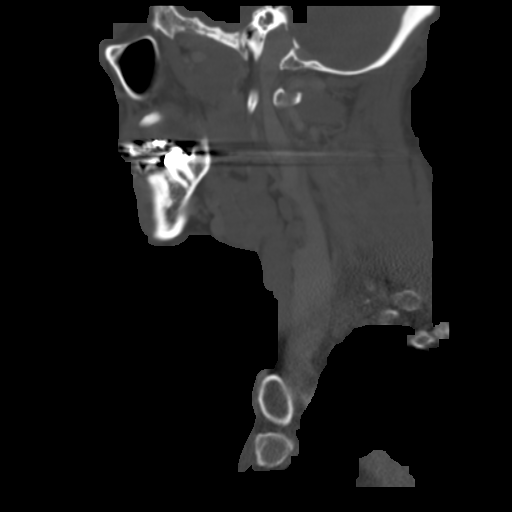

[15 of 33 positions shown; findings below may reference images not displayed]

FINDINGS: No significant mucosal or submucosal lesion is present.
The vocal cords are midline and symmetric.

The area marked corresponds with the left submandibular gland which
is significantly larger than the right submandibular gland.  The
duct does not appear obstructed.  No discrete mass lesion is
evident.

No significant adenopathy is present in the neck.

The lung apices are clear.

The bone windows demonstrate multilevel degenerative change, most
evident from C4-5 through C7-T1.
IMPRESSION: 1.  Asymmetric left submandibular gland.  I favor this is atrophy
of the right submandibular gland.  Although this could represent
inflammation of the left submandibular gland, no secondary signs
inflammation are present.  There is no obstruction of the duct or
focal mass lesion.
2.  No significant adenopathy.
3.  Moderate degenerative changes within the cervical spine.

## 2013-03-19 ENCOUNTER — Ambulatory Visit (INDEPENDENT_AMBULATORY_CARE_PROVIDER_SITE_OTHER): Payer: BC Managed Care – PPO

## 2013-03-19 DIAGNOSIS — J309 Allergic rhinitis, unspecified: Secondary | ICD-10-CM

## 2013-04-04 ENCOUNTER — Ambulatory Visit (INDEPENDENT_AMBULATORY_CARE_PROVIDER_SITE_OTHER): Payer: BC Managed Care – PPO

## 2013-04-04 DIAGNOSIS — J309 Allergic rhinitis, unspecified: Secondary | ICD-10-CM

## 2013-04-10 ENCOUNTER — Ambulatory Visit (INDEPENDENT_AMBULATORY_CARE_PROVIDER_SITE_OTHER): Payer: BC Managed Care – PPO

## 2013-04-10 DIAGNOSIS — J309 Allergic rhinitis, unspecified: Secondary | ICD-10-CM

## 2013-04-11 ENCOUNTER — Ambulatory Visit: Payer: BC Managed Care – PPO

## 2013-04-16 ENCOUNTER — Encounter: Payer: Self-pay | Admitting: Internal Medicine

## 2013-04-18 ENCOUNTER — Ambulatory Visit (INDEPENDENT_AMBULATORY_CARE_PROVIDER_SITE_OTHER): Payer: BC Managed Care – PPO

## 2013-04-18 DIAGNOSIS — J309 Allergic rhinitis, unspecified: Secondary | ICD-10-CM

## 2013-04-24 ENCOUNTER — Ambulatory Visit: Payer: BC Managed Care – PPO

## 2013-04-25 ENCOUNTER — Ambulatory Visit (INDEPENDENT_AMBULATORY_CARE_PROVIDER_SITE_OTHER): Payer: BC Managed Care – PPO

## 2013-04-25 DIAGNOSIS — J309 Allergic rhinitis, unspecified: Secondary | ICD-10-CM

## 2013-05-02 ENCOUNTER — Ambulatory Visit: Payer: BC Managed Care – PPO

## 2013-05-07 ENCOUNTER — Ambulatory Visit (INDEPENDENT_AMBULATORY_CARE_PROVIDER_SITE_OTHER): Payer: BC Managed Care – PPO

## 2013-05-07 DIAGNOSIS — J309 Allergic rhinitis, unspecified: Secondary | ICD-10-CM

## 2013-05-16 ENCOUNTER — Ambulatory Visit (INDEPENDENT_AMBULATORY_CARE_PROVIDER_SITE_OTHER): Payer: BC Managed Care – PPO

## 2013-05-16 DIAGNOSIS — J309 Allergic rhinitis, unspecified: Secondary | ICD-10-CM

## 2013-05-23 ENCOUNTER — Ambulatory Visit (INDEPENDENT_AMBULATORY_CARE_PROVIDER_SITE_OTHER): Payer: BC Managed Care – PPO

## 2013-05-23 DIAGNOSIS — J309 Allergic rhinitis, unspecified: Secondary | ICD-10-CM

## 2013-05-30 ENCOUNTER — Ambulatory Visit (INDEPENDENT_AMBULATORY_CARE_PROVIDER_SITE_OTHER): Payer: BC Managed Care – PPO

## 2013-05-30 DIAGNOSIS — J309 Allergic rhinitis, unspecified: Secondary | ICD-10-CM

## 2013-06-06 ENCOUNTER — Ambulatory Visit (INDEPENDENT_AMBULATORY_CARE_PROVIDER_SITE_OTHER): Payer: BC Managed Care – PPO

## 2013-06-06 DIAGNOSIS — J309 Allergic rhinitis, unspecified: Secondary | ICD-10-CM

## 2013-06-13 ENCOUNTER — Ambulatory Visit: Payer: BC Managed Care – PPO

## 2013-06-16 ENCOUNTER — Ambulatory Visit (INDEPENDENT_AMBULATORY_CARE_PROVIDER_SITE_OTHER): Payer: BC Managed Care – PPO

## 2013-06-16 DIAGNOSIS — J309 Allergic rhinitis, unspecified: Secondary | ICD-10-CM

## 2013-06-26 ENCOUNTER — Ambulatory Visit (INDEPENDENT_AMBULATORY_CARE_PROVIDER_SITE_OTHER): Payer: BC Managed Care – PPO

## 2013-06-26 DIAGNOSIS — J309 Allergic rhinitis, unspecified: Secondary | ICD-10-CM

## 2013-07-04 ENCOUNTER — Ambulatory Visit (INDEPENDENT_AMBULATORY_CARE_PROVIDER_SITE_OTHER): Payer: BC Managed Care – PPO

## 2013-07-04 DIAGNOSIS — J309 Allergic rhinitis, unspecified: Secondary | ICD-10-CM

## 2013-07-07 ENCOUNTER — Ambulatory Visit (INDEPENDENT_AMBULATORY_CARE_PROVIDER_SITE_OTHER): Payer: BC Managed Care – PPO

## 2013-07-07 DIAGNOSIS — J309 Allergic rhinitis, unspecified: Secondary | ICD-10-CM

## 2013-07-11 ENCOUNTER — Ambulatory Visit: Payer: BC Managed Care – PPO

## 2013-07-18 ENCOUNTER — Ambulatory Visit (INDEPENDENT_AMBULATORY_CARE_PROVIDER_SITE_OTHER): Payer: BC Managed Care – PPO

## 2013-07-18 DIAGNOSIS — J309 Allergic rhinitis, unspecified: Secondary | ICD-10-CM

## 2013-07-25 ENCOUNTER — Ambulatory Visit (INDEPENDENT_AMBULATORY_CARE_PROVIDER_SITE_OTHER): Payer: BC Managed Care – PPO

## 2013-07-25 DIAGNOSIS — J309 Allergic rhinitis, unspecified: Secondary | ICD-10-CM

## 2013-08-07 ENCOUNTER — Ambulatory Visit (INDEPENDENT_AMBULATORY_CARE_PROVIDER_SITE_OTHER): Payer: BC Managed Care – PPO

## 2013-08-07 DIAGNOSIS — J309 Allergic rhinitis, unspecified: Secondary | ICD-10-CM

## 2013-08-15 ENCOUNTER — Ambulatory Visit (INDEPENDENT_AMBULATORY_CARE_PROVIDER_SITE_OTHER): Payer: BC Managed Care – PPO

## 2013-08-15 DIAGNOSIS — J309 Allergic rhinitis, unspecified: Secondary | ICD-10-CM

## 2013-08-18 ENCOUNTER — Encounter: Payer: Self-pay | Admitting: Internal Medicine

## 2013-08-22 ENCOUNTER — Ambulatory Visit: Payer: BC Managed Care – PPO

## 2013-08-29 ENCOUNTER — Ambulatory Visit (INDEPENDENT_AMBULATORY_CARE_PROVIDER_SITE_OTHER): Payer: BC Managed Care – PPO

## 2013-08-29 DIAGNOSIS — J309 Allergic rhinitis, unspecified: Secondary | ICD-10-CM

## 2013-09-04 ENCOUNTER — Ambulatory Visit (INDEPENDENT_AMBULATORY_CARE_PROVIDER_SITE_OTHER): Payer: BC Managed Care – PPO

## 2013-09-04 DIAGNOSIS — J309 Allergic rhinitis, unspecified: Secondary | ICD-10-CM

## 2013-09-05 ENCOUNTER — Ambulatory Visit: Payer: BC Managed Care – PPO

## 2013-09-12 ENCOUNTER — Ambulatory Visit (INDEPENDENT_AMBULATORY_CARE_PROVIDER_SITE_OTHER): Payer: BC Managed Care – PPO

## 2013-09-12 DIAGNOSIS — J309 Allergic rhinitis, unspecified: Secondary | ICD-10-CM

## 2013-09-18 ENCOUNTER — Ambulatory Visit (INDEPENDENT_AMBULATORY_CARE_PROVIDER_SITE_OTHER): Payer: BC Managed Care – PPO

## 2013-09-18 DIAGNOSIS — J309 Allergic rhinitis, unspecified: Secondary | ICD-10-CM

## 2013-09-26 ENCOUNTER — Ambulatory Visit (INDEPENDENT_AMBULATORY_CARE_PROVIDER_SITE_OTHER): Payer: BC Managed Care – PPO

## 2013-09-26 DIAGNOSIS — J309 Allergic rhinitis, unspecified: Secondary | ICD-10-CM

## 2013-10-03 ENCOUNTER — Ambulatory Visit (INDEPENDENT_AMBULATORY_CARE_PROVIDER_SITE_OTHER): Payer: BC Managed Care – PPO

## 2013-10-03 DIAGNOSIS — J309 Allergic rhinitis, unspecified: Secondary | ICD-10-CM

## 2013-10-24 ENCOUNTER — Ambulatory Visit: Payer: BC Managed Care – PPO

## 2013-10-31 ENCOUNTER — Ambulatory Visit (INDEPENDENT_AMBULATORY_CARE_PROVIDER_SITE_OTHER): Payer: BC Managed Care – PPO

## 2013-10-31 DIAGNOSIS — J309 Allergic rhinitis, unspecified: Secondary | ICD-10-CM

## 2013-11-03 ENCOUNTER — Telehealth: Payer: Self-pay | Admitting: Internal Medicine

## 2013-11-03 DIAGNOSIS — Z91018 Allergy to other foods: Secondary | ICD-10-CM

## 2013-11-03 NOTE — Telephone Encounter (Signed)
Pt. Is very annoyed every time he eats: a piece of cheese, pizza,ice cream,he sneezes 6 or 7 times. He has started drinking soy milk and also puts it in his cereal; no sneezing. Is there a blood test he can take and is there anything he can do besides stay away from dairy? Please advise.

## 2013-11-04 NOTE — Telephone Encounter (Signed)
We can go over this when he comes for his appointment

## 2013-11-04 NOTE — Telephone Encounter (Signed)
I mistook his scheduled allergy shot for an ov. Lets offer him order lab- Food IgE profile  For dx food allergy

## 2013-11-04 NOTE — Telephone Encounter (Signed)
He is not scheduled for OV with CY until 01-2014. Thanks.

## 2013-11-04 NOTE — Telephone Encounter (Signed)
LMTCB

## 2013-11-05 ENCOUNTER — Other Ambulatory Visit: Payer: BC Managed Care – PPO

## 2013-11-05 ENCOUNTER — Ambulatory Visit (INDEPENDENT_AMBULATORY_CARE_PROVIDER_SITE_OTHER): Payer: BC Managed Care – PPO

## 2013-11-05 DIAGNOSIS — J309 Allergic rhinitis, unspecified: Secondary | ICD-10-CM

## 2013-11-05 DIAGNOSIS — Z91018 Allergy to other foods: Secondary | ICD-10-CM

## 2013-11-05 NOTE — Telephone Encounter (Signed)
Pt came in for his allergy injection today-I spoke with him and he is aware to go down for lab work today. We will call him with results. Nothing more needed at this time.

## 2013-11-06 ENCOUNTER — Ambulatory Visit: Payer: BC Managed Care – PPO

## 2013-11-06 LAB — ALLERGEN FOOD PROFILE SPECIFIC IGE
Chicken IgE: <0.1 kU/L
Egg White IgE: <0.1 kU/L
IGE (IMMUNOGLOBULIN E), SERUM: 15.4 [IU]/mL (ref 0.0–180.0)
Milk IgE: <0.1 kU/L
Orange: <0.1 kU/L
Peanut IgE: <0.1 kU/L
Shrimp IgE: <0.1 kU/L
Tuna IgE: <0.1 kU/L

## 2013-11-14 ENCOUNTER — Ambulatory Visit (INDEPENDENT_AMBULATORY_CARE_PROVIDER_SITE_OTHER): Payer: BC Managed Care – PPO

## 2013-11-14 DIAGNOSIS — J309 Allergic rhinitis, unspecified: Secondary | ICD-10-CM

## 2013-11-21 ENCOUNTER — Ambulatory Visit (INDEPENDENT_AMBULATORY_CARE_PROVIDER_SITE_OTHER): Payer: BC Managed Care – PPO

## 2013-11-21 DIAGNOSIS — J309 Allergic rhinitis, unspecified: Secondary | ICD-10-CM

## 2013-12-01 ENCOUNTER — Ambulatory Visit (INDEPENDENT_AMBULATORY_CARE_PROVIDER_SITE_OTHER): Payer: BC Managed Care – PPO

## 2013-12-01 DIAGNOSIS — J309 Allergic rhinitis, unspecified: Secondary | ICD-10-CM

## 2013-12-05 ENCOUNTER — Ambulatory Visit (INDEPENDENT_AMBULATORY_CARE_PROVIDER_SITE_OTHER): Payer: BC Managed Care – PPO

## 2013-12-05 DIAGNOSIS — J309 Allergic rhinitis, unspecified: Secondary | ICD-10-CM

## 2013-12-12 ENCOUNTER — Ambulatory Visit (INDEPENDENT_AMBULATORY_CARE_PROVIDER_SITE_OTHER): Payer: BC Managed Care – PPO

## 2013-12-12 DIAGNOSIS — J309 Allergic rhinitis, unspecified: Secondary | ICD-10-CM

## 2013-12-19 ENCOUNTER — Ambulatory Visit (INDEPENDENT_AMBULATORY_CARE_PROVIDER_SITE_OTHER): Payer: BC Managed Care – PPO

## 2013-12-19 DIAGNOSIS — J309 Allergic rhinitis, unspecified: Secondary | ICD-10-CM

## 2013-12-26 ENCOUNTER — Ambulatory Visit: Payer: BC Managed Care – PPO

## 2014-01-02 ENCOUNTER — Ambulatory Visit (INDEPENDENT_AMBULATORY_CARE_PROVIDER_SITE_OTHER): Payer: BC Managed Care – PPO

## 2014-01-02 DIAGNOSIS — J309 Allergic rhinitis, unspecified: Secondary | ICD-10-CM

## 2014-01-09 ENCOUNTER — Ambulatory Visit: Payer: BC Managed Care – PPO

## 2014-01-19 ENCOUNTER — Encounter: Payer: Self-pay | Admitting: Internal Medicine

## 2014-01-21 ENCOUNTER — Encounter: Payer: Self-pay | Admitting: Internal Medicine

## 2014-01-23 ENCOUNTER — Ambulatory Visit (INDEPENDENT_AMBULATORY_CARE_PROVIDER_SITE_OTHER): Payer: BC Managed Care – PPO

## 2014-01-23 DIAGNOSIS — J309 Allergic rhinitis, unspecified: Secondary | ICD-10-CM

## 2014-01-26 ENCOUNTER — Other Ambulatory Visit: Payer: BC Managed Care – PPO

## 2014-01-26 ENCOUNTER — Encounter: Payer: Self-pay | Admitting: Internal Medicine

## 2014-01-26 ENCOUNTER — Ambulatory Visit (INDEPENDENT_AMBULATORY_CARE_PROVIDER_SITE_OTHER): Payer: BC Managed Care – PPO | Admitting: Internal Medicine

## 2014-01-26 ENCOUNTER — Ambulatory Visit (INDEPENDENT_AMBULATORY_CARE_PROVIDER_SITE_OTHER): Payer: BC Managed Care – PPO | Admitting: *Deleted

## 2014-01-26 VITALS — BP 110/76 | HR 88 | Ht 68.0 in | Wt 183.2 lb

## 2014-01-26 DIAGNOSIS — Z23 Encounter for immunization: Secondary | ICD-10-CM

## 2014-01-26 DIAGNOSIS — J309 Allergic rhinitis, unspecified: Secondary | ICD-10-CM

## 2014-01-26 DIAGNOSIS — Z91018 Allergy to other foods: Secondary | ICD-10-CM

## 2014-01-26 DIAGNOSIS — K904 Malabsorption due to intolerance, not elsewhere classified: Secondary | ICD-10-CM

## 2014-01-26 DIAGNOSIS — J301 Allergic rhinitis due to pollen: Secondary | ICD-10-CM

## 2014-01-26 DIAGNOSIS — K9049 Malabsorption due to intolerance, not elsewhere classified: Secondary | ICD-10-CM

## 2014-01-26 DIAGNOSIS — G4733 Obstructive sleep apnea (adult) (pediatric): Secondary | ICD-10-CM

## 2014-01-26 NOTE — Patient Instructions (Signed)
Order- lab- Allergy profile,    Dairy, Mold  We can continue allergy vaccine 1:10 GH  Remember to add a steroid nasal steroid spray like otc Flonase/ fluticasone, or Nasacort  1-2 puffs each nostril once daily at bedtime during allergy seasonss  We can continue CPAP 8/ [facility]  Order- DME [facility] download for pressure compliance  Flu shot

## 2014-01-26 NOTE — Progress Notes (Signed)
01/20/13- 27 yoM followed for Allergic rhinitis, hx OSA FOLLOWS FOR: last seen 2012; still on allergy vaccine 1:10 GH and doing well. He chose not to have septoplasty/Dr. Ernesto Rutherford. Gets flu vaccine through his primary physician, Dr. Brigitte Pulse. Spring pollen symptoms are still worse than fall. He supplements with occasional antihistamines in the spring and uses Flonase for intervals as needed. He continues CPAP 8/[facility] all night every night. Machine is old.  01/26/14- 89 yoM followed for Allergic rhinitis, hx OSA FOLLOWS FOR: Still on allergy vaccine 1:10 GH and doing well;  OSA-Continues to wear CPAP 8/ [facility] every night for avg of 6 hours; DME is [facility]-would like to discuss new CPAP machine. Allergy vaccine is still satisfactory. Notices some nasal stuffiness and spring and fall. Discussed supplemental antihistamines and nasal sprays. Food intolerance-food allergy profile had been negative in the past. He notices increased nasal congestion and itching of nose and scalp if he eats cheese, ice cream, but not milk.  ROS-see HPI Constitutional:   No-   weight loss, night sweats, fevers, chills, fatigue, lassitude. HEENT:   No-  headaches, difficulty swallowing, tooth/dental problems, sore throat,       +sneezing, +itching, ear ache, +nasal congestion, post nasal drip,  CV:  No-   chest pain, orthopnea, PND, swelling in lower extremities, anasarca, dizziness, palpitations Resp: No-   shortness of breath with exertion or at rest.              No-   productive cough,  No non-productive cough,  No- coughing up of blood.              No-   change in color of mucus.  No- wheezing.   Skin: No-   rash or lesions. GI:  No-   heartburn, indigestion, abdominal pain, nausea, vomiting,  GU:  MS:  No-   joint pain or swelling.   Neuro-     nothing unusual Psych:  No- change in mood or affect. No depression or anxiety.  No memory loss.  OBJ- Physical Exam General- Alert, Oriented,  Affect-appropriate, Distress- none acute Skin- rash-none, lesions- none, excoriation- none Lymphadenopathy- none Head- atraumatic            Eyes- Gross vision intact, PERRLA, conjunctivae and secretions clear            Ears- Hearing, canals-normal            Nose-  no-Septal dev, no-mucus bridging, no-polyps, erosion, perforation             Throat- Mallampati III , mucosa clear , drainage- none, tonsils- atrophic Neck- flexible , trachea midline, no stridor , thyroid nl, carotid no bruit Chest - symmetrical excursion , unlabored           Heart/CV- RRR , no murmur , no gallop  , no rub, nl s1 s2                           - JVD- none , edema- none, stasis changes- none, varices- none           Lung- clear to P&A, wheeze- none, cough- none , dullness-none, rub- none           Chest wall-  Abd-  Br/ Gen/ Rectal- Not done, not indicated Extrem- cyanosis- none, clubbing, none, atrophy- none, strength- nl Neuro- grossly intact to observation

## 2014-01-28 LAB — ALLERGEN, CHEESE, CHEDDAR,F81

## 2014-01-28 LAB — ALLERGEN, CHEESE, MOLD, F82: Allergen, Cheese,Mold,f82: <0.1 kU/L

## 2014-01-28 LAB — ALLERGEN MILK

## 2014-01-30 ENCOUNTER — Ambulatory Visit: Payer: BC Managed Care – PPO

## 2014-02-01 DIAGNOSIS — K9049 Malabsorption due to intolerance, not elsewhere classified: Secondary | ICD-10-CM | POA: Insufficient documentation

## 2014-02-01 NOTE — Assessment & Plan Note (Signed)
He is convinced that cheese and ice cream but not milk seem to cause sneeze and itching of nose and scalp. Question at this could be a temperature triggered effect or possibly mold related Plan-mold and dairy IgE, avoid foods which trigger symptoms

## 2014-02-01 NOTE — Assessment & Plan Note (Addendum)
He remains comfortable with CPAP 8. We discussed weight control and safe driving responsibility Plan-download

## 2014-02-01 NOTE — Assessment & Plan Note (Signed)
Okay to continue allergy vaccine 1:10 GH

## 2014-02-02 LAB — ALLERGEN, CHEESE PARMESAN: Class, Allergen Parmesan Cheese: 0

## 2014-02-03 LAB — ELECTROLYTE PANEL
Anion Gap: 12 mmol/L (ref 9–17)
CO2: 27 mmol/L (ref 20–31)
Chloride: 104 mmol/L (ref 98–107)
Potassium: 4.5 mmol/L (ref 3.7–5.3)
Sodium: 143 mmol/L (ref 135–144)

## 2014-02-03 LAB — CREATININE
Creatinine: 0.93 mg/dL (ref 0.70–1.20)
GFR African American: >60 mL/min (ref >60)
GFR Non-African American: >60 mL/min (ref >60)

## 2014-02-03 LAB — LIPID PANEL
Chol/HDL Ratio: 3.7 (ref <5)
Cholesterol: 128 mg/dL (ref <200)
HDL: 35 mg/dL — ABNORMAL LOW (ref >40)
LDL Cholesterol: 71 mg/dL (ref 0–130)
Triglycerides: 112 mg/dL (ref <150)

## 2014-02-03 LAB — CBC WITH AUTO DIFFERENTIAL
Absolute Eos #: 0.3 10*3/uL (ref 0.0–0.4)
Absolute Lymph #: 3 10*3/uL (ref 1.0–4.8)
Absolute Mono #: 0.7 10*3/uL (ref 0.1–1.2)
Basophils Absolute: 0.1 10*3/uL (ref 0.0–0.2)
Basophils: 1 % (ref 0–2)
Eosinophils %: 4 % (ref 1–4)
Hematocrit: 42.8 % (ref 41–53)
Hemoglobin: 14.2 g/dL (ref 13.5–17.5)
Lymphocytes: 39 % (ref 24–44)
MCH: 29.9 pg (ref 26–34)
MCHC: 33.2 g/dL (ref 31–37)
MCV: 90.3 fL (ref 80–100)
MPV: 9.1 fL (ref 6.0–12.0)
Monocytes: 9 % (ref 2–11)
Platelets: 285 10*3/uL (ref 140–450)
RBC: 4.74 m/uL (ref 4.5–5.9)
RDW: 13.4 % (ref 12.5–15.4)
Seg Neutrophils: 47 % (ref 36–66)
Segs Absolute: 3.6 10*3/uL (ref 1.8–7.7)
WBC: 7.7 10*3/uL (ref 3.5–11.0)

## 2014-02-03 LAB — BUN: BUN: 14 mg/dL (ref 8–23)

## 2014-02-03 LAB — HEPATIC FUNCTION PANEL
ALT: 29 U/L (ref 5–41)
AST: 22 U/L (ref <40)
Albumin/Globulin Ratio: 1.9 (ref 1.0–2.5)
Albumin: 4.3 g/dL (ref 3.5–5.2)
Alkaline Phosphatase: 112 U/L (ref 40–129)
Bilirubin, Direct: <0.08 mg/dL (ref <0.31)
Total Bilirubin: 0.48 mg/dL (ref 0.3–1.2)
Total Protein: 6.6 g/dL (ref 6.4–8.3)

## 2014-02-03 LAB — GLUCOSE, RANDOM: Glucose: 85 mg/dL (ref 70–99)

## 2014-02-03 LAB — T4, FREE: Thyroxine, Free: 1.17 ng/dL (ref 0.93–1.70)

## 2014-02-03 LAB — TSH: TSH: 2.42 mIU/L (ref 0.30–5.00)

## 2014-02-04 LAB — PSA SCREENING: PSA: 1.14 ug/L (ref <4.1)

## 2014-02-06 ENCOUNTER — Ambulatory Visit (INDEPENDENT_AMBULATORY_CARE_PROVIDER_SITE_OTHER): Payer: BC Managed Care – PPO

## 2014-02-06 DIAGNOSIS — J309 Allergic rhinitis, unspecified: Secondary | ICD-10-CM

## 2014-02-11 ENCOUNTER — Ambulatory Visit (INDEPENDENT_AMBULATORY_CARE_PROVIDER_SITE_OTHER): Payer: BC Managed Care – PPO

## 2014-02-11 DIAGNOSIS — J309 Allergic rhinitis, unspecified: Secondary | ICD-10-CM

## 2014-02-16 ENCOUNTER — Ambulatory Visit (INDEPENDENT_AMBULATORY_CARE_PROVIDER_SITE_OTHER): Payer: BC Managed Care – PPO

## 2014-02-16 DIAGNOSIS — J309 Allergic rhinitis, unspecified: Secondary | ICD-10-CM

## 2014-02-24 ENCOUNTER — Encounter: Payer: Self-pay | Admitting: Internal Medicine

## 2014-02-24 ENCOUNTER — Ambulatory Visit (INDEPENDENT_AMBULATORY_CARE_PROVIDER_SITE_OTHER): Payer: BC Managed Care – PPO | Admitting: Internal Medicine

## 2014-02-24 VITALS — BP 118/68 | HR 101 | Ht 68.0 in | Wt 182.0 lb

## 2014-02-24 DIAGNOSIS — J309 Allergic rhinitis, unspecified: Secondary | ICD-10-CM

## 2014-02-24 DIAGNOSIS — J301 Allergic rhinitis due to pollen: Secondary | ICD-10-CM

## 2014-02-24 DIAGNOSIS — G4733 Obstructive sleep apnea (adult) (pediatric): Secondary | ICD-10-CM

## 2014-02-24 NOTE — Assessment & Plan Note (Signed)
Appropriate to reassess his sleep status and probably update his machine after February when he becomes Medicare eligible so there won't be confusion with change of insurance coverage at that time. Plan-schedule sleep study to be done after his birthday. Return 2 weeks later.

## 2014-02-24 NOTE — Assessment & Plan Note (Signed)
Persistent nasal stuffiness and turbinate edema unlikely to be related to pollen at this time of year. There may be a vasomotor component. CPAP does not seem to improve it now. Plan-try nasal saline gel for hydration, very occasional and conservative trial of Afrin if needed.

## 2014-02-24 NOTE — Patient Instructions (Signed)
See if using a little of the saline nasal gel in your nose at bedtime makes the stuffiness problem any better. If you have to, you can use 1 puff of Afrin in each nostril just at bedtime.  Order- Split protocol NPSG to be done after 04/28/14  For dx OSA  We will see you back a couple of weeks after your sleep study to look at replacing your old CPAP machine.  Please call as needed

## 2014-02-24 NOTE — Progress Notes (Signed)
01/20/13- 46 yoM followed for Allergic rhinitis, hx OSA FOLLOWS FOR: last seen 2012; still on allergy vaccine 1:10 GH and doing well. He chose not to have septoplasty/Dr. Ernesto Rutherford. Gets flu vaccine through his primary physician, Dr. Brigitte Pulse. Spring pollen symptoms are still worse than fall. He supplements with occasional antihistamines in the spring and uses Flonase for intervals as needed. He continues CPAP 8/[facility] all night every night. Machine is old.  01/26/14- 69 yoM followed for Allergic rhinitis, hx OSA FOLLOWS FOR: Still on allergy vaccine 1:10 GH and doing well;  OSA-Continues to wear CPAP 8/ [facility] every night for avg of 6 hours; DME is [facility]-would like to discuss new CPAP machine. Allergy vaccine is still satisfactory. Notices some nasal stuffiness and spring and fall. Discussed supplemental antihistamines and nasal sprays. Food intolerance-food allergy profile had been negative in the past. He notices increased nasal congestion and itching of nose and scalp if he eats cheese, ice cream, but not milk.  02/24/14- 46 yoM followed for Allergic rhinitis, hx OSA FOLLOWS FOR: states that he needs his cpap 8/ [facility] replaced per his dme, [facility].  states that dme company also told him he needed a new sleep study every 10 years. States the card that reads the machine is malfunctioning.  Has been using CPAP every night  Continues allergy vaccine 1:10 GH. Persistent nasal stuffiness without much sneezing or drainage. We don't find results from labs drawn November 9.  ROS-see HPI Constitutional:   No-   weight loss, night sweats, fevers, chills, fatigue, lassitude. HEENT:   No-  headaches, difficulty swallowing, tooth/dental problems, sore throat,       +sneezing, +itching, ear ache, +nasal congestion, post nasal drip,  CV:  No-   chest pain, orthopnea, PND, swelling in lower extremities, anasarca, dizziness, palpitations Resp: No-   shortness of breath with exertion or at rest.            No-   productive cough,  No non-productive cough,  No- coughing up of blood.              No-   change in color of mucus.  No- wheezing.   Skin: No-   rash or lesions. GI:  No-   heartburn, indigestion, abdominal pain, nausea, vomiting,  GU:  MS:  No-   joint pain or swelling.   Neuro-     nothing unusual Psych:  No- change in mood or affect. No depression or anxiety.  No memory loss.  OBJ- Physical Exam General- Alert, Oriented, Affect-appropriate, Distress- none acute Skin- rash-none, lesions- none, excoriation- none Lymphadenopathy- none Head- atraumatic            Eyes- Gross vision intact, PERRLA, conjunctivae and secretions clear            Ears- Hearing, canals-normal            Nose-  turbinate edema +, no-Septal dev, no-mucus bridging, no-polyps, erosion, perforation             Throat- Mallampati III , mucosa clear , drainage- none, tonsils- atrophic Neck- flexible , trachea midline, no stridor , thyroid nl, carotid no bruit Chest - symmetrical excursion , unlabored           Heart/CV- RRR , no murmur , no gallop  , no rub, nl s1 s2                           -  JVD- none , edema- none, stasis changes- none, varices- none           Lung- clear to P&A, wheeze- none, cough- none , dullness-none, rub- none           Chest wall-  Abd-  Br/ Gen/ Rectal- Not done, not indicated Extrem- cyanosis- none, clubbing, none, atrophy- none, strength- nl Neuro- grossly intact to observation

## 2014-02-27 ENCOUNTER — Ambulatory Visit (INDEPENDENT_AMBULATORY_CARE_PROVIDER_SITE_OTHER): Payer: BC Managed Care – PPO

## 2014-02-27 DIAGNOSIS — J309 Allergic rhinitis, unspecified: Secondary | ICD-10-CM

## 2014-03-06 ENCOUNTER — Ambulatory Visit (INDEPENDENT_AMBULATORY_CARE_PROVIDER_SITE_OTHER): Payer: BC Managed Care – PPO

## 2014-03-06 DIAGNOSIS — J309 Allergic rhinitis, unspecified: Secondary | ICD-10-CM

## 2014-03-10 ENCOUNTER — Ambulatory Visit (INDEPENDENT_AMBULATORY_CARE_PROVIDER_SITE_OTHER): Payer: BC Managed Care – PPO

## 2014-03-10 DIAGNOSIS — J309 Allergic rhinitis, unspecified: Secondary | ICD-10-CM

## 2014-03-24 ENCOUNTER — Ambulatory Visit (INDEPENDENT_AMBULATORY_CARE_PROVIDER_SITE_OTHER): Payer: Self-pay

## 2014-03-24 DIAGNOSIS — J309 Allergic rhinitis, unspecified: Secondary | ICD-10-CM

## 2014-04-03 ENCOUNTER — Ambulatory Visit: Payer: Self-pay

## 2014-05-01 ENCOUNTER — Ambulatory Visit (INDEPENDENT_AMBULATORY_CARE_PROVIDER_SITE_OTHER): Payer: Medicare Other

## 2014-05-01 DIAGNOSIS — J309 Allergic rhinitis, unspecified: Secondary | ICD-10-CM

## 2014-05-08 ENCOUNTER — Ambulatory Visit (INDEPENDENT_AMBULATORY_CARE_PROVIDER_SITE_OTHER): Payer: Medicare Other

## 2014-05-08 DIAGNOSIS — J309 Allergic rhinitis, unspecified: Secondary | ICD-10-CM | POA: Diagnosis not present

## 2014-05-13 ENCOUNTER — Ambulatory Visit (HOSPITAL_BASED_OUTPATIENT_CLINIC_OR_DEPARTMENT_OTHER): Payer: Medicare Other | Attending: Internal Medicine | Admitting: *Deleted

## 2014-05-13 VITALS — Ht 68.0 in | Wt 175.0 lb

## 2014-05-13 DIAGNOSIS — G4730 Sleep apnea, unspecified: Secondary | ICD-10-CM | POA: Diagnosis not present

## 2014-05-13 DIAGNOSIS — G4733 Obstructive sleep apnea (adult) (pediatric): Secondary | ICD-10-CM | POA: Diagnosis not present

## 2014-05-15 ENCOUNTER — Ambulatory Visit: Payer: Medicare Other

## 2014-05-23 DIAGNOSIS — G4733 Obstructive sleep apnea (adult) (pediatric): Secondary | ICD-10-CM | POA: Diagnosis not present
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
## 2014-05-25 DIAGNOSIS — L820 Inflamed seborrheic keratosis: Secondary | ICD-10-CM | POA: Diagnosis not present

## 2014-05-25 DIAGNOSIS — L578 Other skin changes due to chronic exposure to nonionizing radiation: Secondary | ICD-10-CM | POA: Diagnosis not present

## 2014-05-25 DIAGNOSIS — L570 Actinic keratosis: Secondary | ICD-10-CM | POA: Diagnosis not present

## 2014-05-25 DIAGNOSIS — L821 Other seborrheic keratosis: Secondary | ICD-10-CM | POA: Diagnosis not present

## 2014-05-27 ENCOUNTER — Encounter: Payer: Self-pay | Admitting: Internal Medicine

## 2014-05-27 ENCOUNTER — Ambulatory Visit (INDEPENDENT_AMBULATORY_CARE_PROVIDER_SITE_OTHER): Payer: Medicare Other | Admitting: Internal Medicine

## 2014-05-27 ENCOUNTER — Ambulatory Visit (INDEPENDENT_AMBULATORY_CARE_PROVIDER_SITE_OTHER): Payer: Medicare Other

## 2014-05-27 VITALS — BP 120/76 | HR 96 | Ht 68.0 in | Wt 178.0 lb

## 2014-05-27 DIAGNOSIS — J309 Allergic rhinitis, unspecified: Secondary | ICD-10-CM | POA: Diagnosis not present

## 2014-05-27 DIAGNOSIS — J301 Allergic rhinitis due to pollen: Secondary | ICD-10-CM

## 2014-05-27 DIAGNOSIS — G4733 Obstructive sleep apnea (adult) (pediatric): Secondary | ICD-10-CM | POA: Diagnosis not present

## 2014-05-27 NOTE — Assessment & Plan Note (Signed)
He continues allergy vaccine without problems. Occasional supplementation with an antihistamine or nasal steroid spray may be helpful. Allergy profile in November had been sent out to a reference lab and we have called for copies of the reports because scanning into the computer system is delayed.

## 2014-05-27 NOTE — Progress Notes (Signed)
01/20/13- 26 yoM followed for Allergic rhinitis, hx OSA FOLLOWS FOR: last seen 2012; still on allergy vaccine 1:10 GH and doing well. He chose not to have septoplasty/Dr. Ernesto Rutherford. Gets flu vaccine through his primary physician, Dr. Brigitte Pulse. Spring pollen symptoms are still worse than fall. He supplements with occasional antihistamines in the spring and uses Flonase for intervals as needed. He continues CPAP 8/[facility] all night every night. Machine is old.  01/26/14- 54 yoM followed for Allergic rhinitis, hx OSA FOLLOWS FOR: Still on allergy vaccine 1:10 GH and doing well;  OSA-Continues to wear CPAP 8/ [facility] every night for avg of 6 hours; DME is [facility]-would like to discuss new CPAP machine. Allergy vaccine is still satisfactory. Notices some nasal stuffiness and spring and fall. Discussed supplemental antihistamines and nasal sprays. Food intolerance-food allergy profile had been negative in the past. He notices increased nasal congestion and itching of nose and scalp if he eats cheese, ice cream, but not milk.  02/24/14- 38 yoM followed for Allergic rhinitis, hx OSA FOLLOWS FOR: states that he needs his cpap 8/ [facility] replaced per his dme, [facility].  states that dme company also told him he needed a new sleep study every 10 years. States the card that reads the machine is malfunctioning.  Has been using CPAP every night  Continues allergy vaccine 1:10 GH. Persistent nasal stuffiness without much sneezing or drainage. We don't find results from labs drawn November 9.  05/27/14-64 yoM followed for Allergic rhinitis, hx OSA sleep apnea; sleeps approx 7 hrs night; uses CPAP 8/ [facility]; Allergy vaccine 1:10 [place] NPSG 05/13/14- verified obstructive sleep apnea, AHI 11 per hour, body weight 175 pounds. He qualifies for renewal of CPAP, which he very much wants to do. He is needing a replacement machine. Early Spring seasonal nasal stuffiness and drainage is noted, being treated with OTC  antihistamine.  ROS-see HPI Constitutional:   No-   weight loss, night sweats, fevers, chills, fatigue, lassitude. HEENT:   No-  headaches, difficulty swallowing, tooth/dental problems, sore throat,       +sneezing, +itching, ear ache, +nasal congestion, post nasal drip,  CV:  No-   chest pain, orthopnea, PND, swelling in lower extremities, anasarca, dizziness, palpitations Resp: No-   shortness of breath with exertion or at rest.              No-   productive cough,  No non-productive cough,  No- coughing up of blood.              No-   change in color of mucus.  No- wheezing.   Skin: No-   rash or lesions. GI:  No-   heartburn, indigestion, abdominal pain, nausea, vomiting,  GU:  MS:  No-   joint pain or swelling.   Neuro-     nothing unusual Psych:  No- change in mood or affect. No depression or anxiety.  No memory loss.  OBJ- Physical Exam General- Alert, Oriented, Affect-appropriate, Distress- none acute Skin- rash-none, lesions- none, excoriation- none Lymphadenopathy- none Head- atraumatic            Eyes- Gross vision intact, PERRLA, conjunctivae and secretions clear            Ears- Hearing, canals-normal            Nose-  turbinate edema +, no-Septal dev, no-mucus bridging, no-polyps, erosion, perforation             Throat- Mallampati III , mucosa clear ,  drainage- none, tonsils- atrophic Neck- flexible , trachea midline, no stridor , thyroid nl, carotid no bruit Chest - symmetrical excursion , unlabored           Heart/CV- RRR , no murmur , no gallop  , no rub, nl s1 s2                           - JVD- none , edema- none, stasis changes- none, varices- none           Lung- clear to P&A, wheeze- none, cough- none , dullness-none, rub- none           Chest wall-  Abd-  Br/ Gen/ Rectal- Not done, not indicated Extrem- cyanosis- none, clubbing, none, atrophy- none, strength- nl Neuro- grossly intact to observation

## 2014-05-27 NOTE — Patient Instructions (Signed)
We can continue allergy vaccine 1:10 GH  You can add an otc antihistamine like allegra or claritin, and/ or an allergy nasal spray like otc Flonase or Nasacort if needed through the allergy seasons.  Order- DME [facility]- replacement for old CPAP machine, 8 cwp, mask of choice, humidifier, supplies    Dx OSA

## 2014-05-27 NOTE — Assessment & Plan Note (Signed)
His compliance and control has been good at a pressure setting of 8. Plan-replacement CPAP machine, continue pressure at 8 with mask of choice.

## 2014-06-05 ENCOUNTER — Ambulatory Visit (INDEPENDENT_AMBULATORY_CARE_PROVIDER_SITE_OTHER): Payer: Medicare Other

## 2014-06-05 DIAGNOSIS — J309 Allergic rhinitis, unspecified: Secondary | ICD-10-CM

## 2014-06-08 ENCOUNTER — Ambulatory Visit (INDEPENDENT_AMBULATORY_CARE_PROVIDER_SITE_OTHER): Payer: Medicare Other

## 2014-06-08 DIAGNOSIS — J309 Allergic rhinitis, unspecified: Secondary | ICD-10-CM

## 2014-06-19 ENCOUNTER — Ambulatory Visit (INDEPENDENT_AMBULATORY_CARE_PROVIDER_SITE_OTHER): Payer: Medicare Other

## 2014-06-19 DIAGNOSIS — H2513 Age-related nuclear cataract, bilateral: Secondary | ICD-10-CM | POA: Diagnosis not present

## 2014-06-19 DIAGNOSIS — J309 Allergic rhinitis, unspecified: Secondary | ICD-10-CM | POA: Diagnosis not present

## 2014-06-26 ENCOUNTER — Ambulatory Visit (INDEPENDENT_AMBULATORY_CARE_PROVIDER_SITE_OTHER): Payer: Medicare Other

## 2014-06-26 DIAGNOSIS — J309 Allergic rhinitis, unspecified: Secondary | ICD-10-CM

## 2014-06-29 ENCOUNTER — Ambulatory Visit (INDEPENDENT_AMBULATORY_CARE_PROVIDER_SITE_OTHER): Payer: Medicare Other

## 2014-06-29 DIAGNOSIS — J309 Allergic rhinitis, unspecified: Secondary | ICD-10-CM | POA: Diagnosis not present

## 2014-07-06 ENCOUNTER — Ambulatory Visit (INDEPENDENT_AMBULATORY_CARE_PROVIDER_SITE_OTHER): Payer: Medicare Other

## 2014-07-06 DIAGNOSIS — J309 Allergic rhinitis, unspecified: Secondary | ICD-10-CM | POA: Diagnosis not present

## 2014-07-07 LAB — HEPATIC FUNCTION PANEL
ALT: 20 U/L (ref 5–41)
AST: 17 U/L (ref <40)
Albumin/Globulin Ratio: 1.8 (ref 1.0–2.5)
Albumin: 4.4 g/dL (ref 3.5–5.2)
Alkaline Phosphatase: 112 U/L (ref 40–129)
Bilirubin, Direct: <0.08 mg/dL (ref <0.31)
Total Bilirubin: 0.45 mg/dL (ref 0.3–1.2)
Total Protein: 6.9 g/dL (ref 6.4–8.3)

## 2014-07-07 LAB — LIPID PANEL
Chol/HDL Ratio: 4.7 (ref <5)
Cholesterol: 163 mg/dL (ref <200)
HDL: 35 mg/dL — ABNORMAL LOW (ref >40)
LDL Cholesterol: 98 mg/dL (ref 0–130)
Triglycerides: 148 mg/dL (ref <150)

## 2014-07-08 DIAGNOSIS — R7301 Impaired fasting glucose: Secondary | ICD-10-CM | POA: Diagnosis not present

## 2014-07-08 DIAGNOSIS — Z125 Encounter for screening for malignant neoplasm of prostate: Secondary | ICD-10-CM | POA: Diagnosis not present

## 2014-07-08 DIAGNOSIS — I10 Essential (primary) hypertension: Secondary | ICD-10-CM | POA: Diagnosis not present

## 2014-07-08 DIAGNOSIS — Z0000 Encounter for general adult medical examination without abnormal findings: Secondary | ICD-10-CM | POA: Diagnosis not present

## 2014-07-13 ENCOUNTER — Encounter: Payer: Self-pay | Admitting: Internal Medicine

## 2014-07-13 ENCOUNTER — Ambulatory Visit (INDEPENDENT_AMBULATORY_CARE_PROVIDER_SITE_OTHER): Payer: Medicare Other

## 2014-07-13 DIAGNOSIS — J309 Allergic rhinitis, unspecified: Secondary | ICD-10-CM

## 2014-07-15 DIAGNOSIS — F900 Attention-deficit hyperactivity disorder, predominantly inattentive type: Secondary | ICD-10-CM | POA: Diagnosis not present

## 2014-07-15 DIAGNOSIS — N401 Enlarged prostate with lower urinary tract symptoms: Secondary | ICD-10-CM | POA: Diagnosis not present

## 2014-07-15 DIAGNOSIS — E785 Hyperlipidemia, unspecified: Secondary | ICD-10-CM | POA: Diagnosis not present

## 2014-07-15 DIAGNOSIS — I10 Essential (primary) hypertension: Secondary | ICD-10-CM | POA: Diagnosis not present

## 2014-07-15 DIAGNOSIS — Z0000 Encounter for general adult medical examination without abnormal findings: Secondary | ICD-10-CM | POA: Diagnosis not present

## 2014-07-15 DIAGNOSIS — G4733 Obstructive sleep apnea (adult) (pediatric): Secondary | ICD-10-CM | POA: Diagnosis not present

## 2014-07-15 DIAGNOSIS — Z6826 Body mass index (BMI) 26.0-26.9, adult: Secondary | ICD-10-CM | POA: Diagnosis not present

## 2014-07-15 DIAGNOSIS — R740 Nonspecific elevation of levels of transaminase and lactic acid dehydrogenase [LDH]: Secondary | ICD-10-CM | POA: Diagnosis not present

## 2014-07-15 DIAGNOSIS — Z1389 Encounter for screening for other disorder: Secondary | ICD-10-CM | POA: Diagnosis not present

## 2014-07-15 DIAGNOSIS — R7301 Impaired fasting glucose: Secondary | ICD-10-CM | POA: Diagnosis not present

## 2014-07-15 DIAGNOSIS — Z23 Encounter for immunization: Secondary | ICD-10-CM | POA: Diagnosis not present

## 2014-07-20 ENCOUNTER — Ambulatory Visit: Payer: Medicare Other

## 2014-07-24 ENCOUNTER — Ambulatory Visit (INDEPENDENT_AMBULATORY_CARE_PROVIDER_SITE_OTHER): Payer: Medicare Other

## 2014-07-24 DIAGNOSIS — J309 Allergic rhinitis, unspecified: Secondary | ICD-10-CM | POA: Diagnosis not present

## 2014-07-27 ENCOUNTER — Ambulatory Visit: Payer: BC Managed Care – PPO | Admitting: Internal Medicine

## 2014-07-27 ENCOUNTER — Ambulatory Visit (INDEPENDENT_AMBULATORY_CARE_PROVIDER_SITE_OTHER): Payer: Medicare Other

## 2014-07-27 DIAGNOSIS — J309 Allergic rhinitis, unspecified: Secondary | ICD-10-CM

## 2014-08-03 ENCOUNTER — Ambulatory Visit (INDEPENDENT_AMBULATORY_CARE_PROVIDER_SITE_OTHER): Payer: Medicare Other

## 2014-08-03 ENCOUNTER — Encounter: Payer: Self-pay | Admitting: Internal Medicine

## 2014-08-03 ENCOUNTER — Ambulatory Visit (INDEPENDENT_AMBULATORY_CARE_PROVIDER_SITE_OTHER): Payer: Medicare Other | Admitting: Internal Medicine

## 2014-08-03 VITALS — BP 128/80 | HR 74 | Ht 68.0 in | Wt 175.6 lb

## 2014-08-03 DIAGNOSIS — J301 Allergic rhinitis due to pollen: Secondary | ICD-10-CM | POA: Diagnosis not present

## 2014-08-03 DIAGNOSIS — J309 Allergic rhinitis, unspecified: Secondary | ICD-10-CM

## 2014-08-03 DIAGNOSIS — G4733 Obstructive sleep apnea (adult) (pediatric): Secondary | ICD-10-CM

## 2014-08-03 NOTE — Progress Notes (Signed)
01/20/13- 20 yoM followed for Allergic rhinitis, hx OSA FOLLOWS FOR: last seen 2012; still on allergy vaccine 1:10 GH and doing well. He chose not to have septoplasty/Dr. Ernesto Rutherford. Gets flu vaccine through his primary physician, Dr. Brigitte Pulse. Spring pollen symptoms are still worse than fall. He supplements with occasional antihistamines in the spring and uses Flonase for intervals as needed. He continues CPAP 8/Advanced all night every night. Machine is old.  01/26/14- 54 yoM followed for Allergic rhinitis, hx OSA FOLLOWS FOR: Still on allergy vaccine 1:10 GH and doing well;  OSA-Continues to wear CPAP 8/ Advanced every night for avg of 6 hours; DME is [facility]-would like to discuss new CPAP machine. Allergy vaccine is still satisfactory. Notices some nasal stuffiness and spring and fall. Discussed supplemental antihistamines and nasal sprays. Food intolerance-food allergy profile had been negative in the past. He notices increased nasal congestion and itching of nose and scalp if he eats cheese, ice cream, but not milk.  02/24/14- 85 yoM followed for Allergic rhinitis, hx OSA FOLLOWS FOR: states that he needs his cpap 8/ Advanced replaced per his dme, [facility].  states that dme company also told him he needed a new sleep study every 10 years. States the card that reads the machine is malfunctioning.  Has been using CPAP every night  Continues allergy vaccine 1:10 GH. Persistent nasal stuffiness without much sneezing or drainage. We don't find results from labs drawn November 9.  05/27/14-64 yoM followed for Allergic rhinitis, hx OSA sleep apnea; sleeps approx 7 hrs night; uses CPAP 8/ Advanced; Allergy vaccine 1:10 [place] NPSG 05/13/14- verified obstructive sleep apnea, AHI 11 per hour, body weight 175 pounds. He qualifies for renewal of CPAP, which he very much wants to do. He is needing a replacement machine. Early Spring seasonal nasal stuffiness and drainage is noted, being treated with OTC  antihistamine.  08/03/14- 21 yoM followed for Allergic rhinitis, hx OSA FOLLOWS FOR: Has new CPAP 8/ Advanced machine and wearing for about 6-7 hours; DME is [facility]. Still on Allergy vaccine 1:10 GH and doing well.  We reviewed his CPAP download. Compliance is adequate and control is excellent. Discussed goals. He had some trouble getting a comfortable nasal pillow mask.  ROS-see HPI Constitutional:   No-   weight loss, night sweats, fevers, chills, fatigue, lassitude. HEENT:   No-  headaches, difficulty swallowing, tooth/dental problems, sore throat,       sneezing, itching, ear ache, +nasal congestion, post nasal drip,  CV:  No-   chest pain, orthopnea, PND, swelling in lower extremities, anasarca, dizziness, palpitations Resp: No-   shortness of breath with exertion or at rest.              No-   productive cough,  No non-productive cough,  No- coughing up of blood.              No-   change in color of mucus.  No- wheezing.   Skin: No-   rash or lesions. GI:  No-   heartburn, indigestion, abdominal pain, nausea, vomiting,  GU:  MS:  No-   joint pain or swelling.   Neuro-     nothing unusual Psych:  No- change in mood or affect. No depression or anxiety.  No memory loss.  OBJ- Physical Exam General- Alert, Oriented, Affect-appropriate, Distress- none acute Skin- rash-none, lesions- none, excoriation- none Lymphadenopathy- none Head- atraumatic            Eyes- Gross vision intact,  PERRLA, conjunctivae and secretions clear            Ears- Hearing, canals-normal            Nose-  turbinate edema +, no-Septal dev, no-mucus bridging, no-polyps, erosion, perforation             Throat- Mallampati III , mucosa clear , drainage- none, tonsils- atrophic Neck- flexible , trachea midline, no stridor , thyroid nl, carotid no bruit Chest - symmetrical excursion , unlabored           Heart/CV- RRR , no murmur , no gallop  , no rub, nl s1 s2                           - JVD- none , edema- none,  stasis changes- none, varices- none           Lung- clear to P&A, wheeze- none, cough- none , dullness-none, rub- none           Chest wall-  Abd-  Br/ Gen/ Rectal- Not done, not indicated Extrem- cyanosis- none, clubbing, none, atrophy- none, strength- nl Neuro- grossly intact to observation

## 2014-08-03 NOTE — Patient Instructions (Signed)
We can continue allergy vaccine 1:10 [place]  Ok to continue CPAP 8/ Advanced  You can look at alternative mask styles on line at: www.cpap.com             Script printed for CPAP mask of choice and supplies

## 2014-08-10 ENCOUNTER — Ambulatory Visit (INDEPENDENT_AMBULATORY_CARE_PROVIDER_SITE_OTHER): Payer: Medicare Other

## 2014-08-10 DIAGNOSIS — J309 Allergic rhinitis, unspecified: Secondary | ICD-10-CM

## 2014-08-19 ENCOUNTER — Ambulatory Visit (INDEPENDENT_AMBULATORY_CARE_PROVIDER_SITE_OTHER): Payer: Medicare Other

## 2014-08-19 DIAGNOSIS — J309 Allergic rhinitis, unspecified: Secondary | ICD-10-CM | POA: Diagnosis not present

## 2014-08-23 NOTE — Assessment & Plan Note (Signed)
Adequate compliance and good control at 8/Advanced. Plan-discussed sources of CPAP supplies and compliance goals

## 2014-08-23 NOTE — Assessment & Plan Note (Signed)
He has done well. Spring season is ending now. He will continue allergy vaccine.

## 2014-08-27 ENCOUNTER — Ambulatory Visit (INDEPENDENT_AMBULATORY_CARE_PROVIDER_SITE_OTHER): Payer: Medicare Other

## 2014-08-27 DIAGNOSIS — J309 Allergic rhinitis, unspecified: Secondary | ICD-10-CM | POA: Diagnosis not present

## 2014-09-04 ENCOUNTER — Ambulatory Visit (INDEPENDENT_AMBULATORY_CARE_PROVIDER_SITE_OTHER): Payer: Medicare Other

## 2014-09-04 DIAGNOSIS — J309 Allergic rhinitis, unspecified: Secondary | ICD-10-CM

## 2014-09-11 ENCOUNTER — Ambulatory Visit (INDEPENDENT_AMBULATORY_CARE_PROVIDER_SITE_OTHER): Payer: Medicare Other

## 2014-09-11 DIAGNOSIS — J309 Allergic rhinitis, unspecified: Secondary | ICD-10-CM

## 2014-09-18 ENCOUNTER — Ambulatory Visit (INDEPENDENT_AMBULATORY_CARE_PROVIDER_SITE_OTHER): Payer: Medicare Other

## 2014-09-18 DIAGNOSIS — J309 Allergic rhinitis, unspecified: Secondary | ICD-10-CM | POA: Diagnosis not present

## 2014-09-25 ENCOUNTER — Ambulatory Visit (INDEPENDENT_AMBULATORY_CARE_PROVIDER_SITE_OTHER): Payer: Medicare Other

## 2014-09-25 DIAGNOSIS — J309 Allergic rhinitis, unspecified: Secondary | ICD-10-CM

## 2014-09-29 ENCOUNTER — Encounter: Payer: Self-pay | Admitting: Internal Medicine

## 2014-10-02 ENCOUNTER — Ambulatory Visit (INDEPENDENT_AMBULATORY_CARE_PROVIDER_SITE_OTHER): Payer: Medicare Other

## 2014-10-02 DIAGNOSIS — J309 Allergic rhinitis, unspecified: Secondary | ICD-10-CM

## 2014-10-09 ENCOUNTER — Ambulatory Visit (INDEPENDENT_AMBULATORY_CARE_PROVIDER_SITE_OTHER): Payer: Medicare Other

## 2014-10-09 DIAGNOSIS — J309 Allergic rhinitis, unspecified: Secondary | ICD-10-CM | POA: Diagnosis not present

## 2014-10-14 ENCOUNTER — Ambulatory Visit (INDEPENDENT_AMBULATORY_CARE_PROVIDER_SITE_OTHER): Payer: Medicare Other

## 2014-10-14 DIAGNOSIS — J309 Allergic rhinitis, unspecified: Secondary | ICD-10-CM

## 2014-10-19 ENCOUNTER — Ambulatory Visit (INDEPENDENT_AMBULATORY_CARE_PROVIDER_SITE_OTHER): Payer: Medicare Other

## 2014-10-19 DIAGNOSIS — J309 Allergic rhinitis, unspecified: Secondary | ICD-10-CM

## 2014-10-21 ENCOUNTER — Ambulatory Visit (INDEPENDENT_AMBULATORY_CARE_PROVIDER_SITE_OTHER): Payer: Medicare Other

## 2014-10-21 ENCOUNTER — Telehealth: Payer: Self-pay | Admitting: Internal Medicine

## 2014-10-21 DIAGNOSIS — J309 Allergic rhinitis, unspecified: Secondary | ICD-10-CM

## 2014-10-21 NOTE — Telephone Encounter (Signed)
Date Mixed: 10/21/14 Vial: 2 Strength: 1:10 Here/Mail/Pick Up: here Mixed By: tbs 

## 2014-11-02 ENCOUNTER — Ambulatory Visit (INDEPENDENT_AMBULATORY_CARE_PROVIDER_SITE_OTHER): Payer: Medicare Other

## 2014-11-02 DIAGNOSIS — J309 Allergic rhinitis, unspecified: Secondary | ICD-10-CM | POA: Diagnosis not present

## 2014-11-05 ENCOUNTER — Encounter: Payer: Self-pay | Admitting: Internal Medicine

## 2014-11-09 ENCOUNTER — Ambulatory Visit (INDEPENDENT_AMBULATORY_CARE_PROVIDER_SITE_OTHER): Payer: Medicare Other

## 2014-11-09 DIAGNOSIS — J309 Allergic rhinitis, unspecified: Secondary | ICD-10-CM | POA: Diagnosis not present

## 2014-11-12 ENCOUNTER — Ambulatory Visit: Payer: Medicare Other

## 2014-11-12 DIAGNOSIS — E785 Hyperlipidemia, unspecified: Secondary | ICD-10-CM | POA: Diagnosis not present

## 2014-11-16 ENCOUNTER — Ambulatory Visit (INDEPENDENT_AMBULATORY_CARE_PROVIDER_SITE_OTHER): Payer: Medicare Other

## 2014-11-16 DIAGNOSIS — J309 Allergic rhinitis, unspecified: Secondary | ICD-10-CM

## 2014-11-24 ENCOUNTER — Ambulatory Visit (INDEPENDENT_AMBULATORY_CARE_PROVIDER_SITE_OTHER): Payer: Medicare Other

## 2014-11-24 DIAGNOSIS — J309 Allergic rhinitis, unspecified: Secondary | ICD-10-CM

## 2014-11-30 ENCOUNTER — Ambulatory Visit: Payer: Medicare Other

## 2014-12-21 ENCOUNTER — Ambulatory Visit (INDEPENDENT_AMBULATORY_CARE_PROVIDER_SITE_OTHER): Payer: Medicare Other

## 2014-12-21 DIAGNOSIS — J309 Allergic rhinitis, unspecified: Secondary | ICD-10-CM | POA: Diagnosis not present

## 2015-01-08 ENCOUNTER — Ambulatory Visit (INDEPENDENT_AMBULATORY_CARE_PROVIDER_SITE_OTHER): Payer: Medicare Other

## 2015-01-08 DIAGNOSIS — J309 Allergic rhinitis, unspecified: Secondary | ICD-10-CM

## 2015-01-11 ENCOUNTER — Ambulatory Visit: Payer: Medicare Other

## 2015-01-19 ENCOUNTER — Ambulatory Visit (INDEPENDENT_AMBULATORY_CARE_PROVIDER_SITE_OTHER): Payer: Medicare Other

## 2015-01-19 DIAGNOSIS — J309 Allergic rhinitis, unspecified: Secondary | ICD-10-CM

## 2015-01-21 DIAGNOSIS — C44529 Squamous cell carcinoma of skin of other part of trunk: Secondary | ICD-10-CM | POA: Diagnosis not present

## 2015-01-21 DIAGNOSIS — L578 Other skin changes due to chronic exposure to nonionizing radiation: Secondary | ICD-10-CM | POA: Diagnosis not present

## 2015-01-21 DIAGNOSIS — L570 Actinic keratosis: Secondary | ICD-10-CM | POA: Diagnosis not present

## 2015-01-21 DIAGNOSIS — L821 Other seborrheic keratosis: Secondary | ICD-10-CM | POA: Diagnosis not present

## 2015-01-25 ENCOUNTER — Ambulatory Visit (INDEPENDENT_AMBULATORY_CARE_PROVIDER_SITE_OTHER): Payer: Medicare Other

## 2015-01-25 DIAGNOSIS — J309 Allergic rhinitis, unspecified: Secondary | ICD-10-CM

## 2015-02-01 ENCOUNTER — Ambulatory Visit (INDEPENDENT_AMBULATORY_CARE_PROVIDER_SITE_OTHER): Payer: Medicare Other

## 2015-02-01 ENCOUNTER — Encounter: Payer: Self-pay | Admitting: Internal Medicine

## 2015-02-01 ENCOUNTER — Ambulatory Visit (INDEPENDENT_AMBULATORY_CARE_PROVIDER_SITE_OTHER): Payer: Medicare Other | Admitting: Internal Medicine

## 2015-02-01 VITALS — BP 126/70 | HR 108 | Ht 68.0 in | Wt 178.6 lb

## 2015-02-01 DIAGNOSIS — J301 Allergic rhinitis due to pollen: Secondary | ICD-10-CM

## 2015-02-01 DIAGNOSIS — G4733 Obstructive sleep apnea (adult) (pediatric): Secondary | ICD-10-CM | POA: Diagnosis not present

## 2015-02-01 DIAGNOSIS — J309 Allergic rhinitis, unspecified: Secondary | ICD-10-CM | POA: Diagnosis not present

## 2015-02-01 NOTE — Progress Notes (Signed)
01/20/13- 54 yoM followed for Allergic rhinitis, hx OSA FOLLOWS FOR: last seen 2012; still on allergy vaccine 1:10 GH and doing well. He chose not to have septoplasty/Dr. Ernesto Rutherford. Gets flu vaccine through his primary physician, Dr. Brigitte Pulse. Spring pollen symptoms are still worse than fall. He supplements with occasional antihistamines in the spring and uses Flonase for intervals as needed. He continues CPAP 8/Advanced all night every night. Machine is old.  01/26/14- 83 yoM followed for Allergic rhinitis, hx OSA FOLLOWS FOR: Still on allergy vaccine 1:10 GH and doing well;  OSA-Continues to wear CPAP 8/ Advanced every night for avg of 6 hours; DME is [facility]-would like to discuss new CPAP machine. Allergy vaccine is still satisfactory. Notices some nasal stuffiness and spring and fall. Discussed supplemental antihistamines and nasal sprays. Food intolerance-food allergy profile had been negative in the past. He notices increased nasal congestion and itching of nose and scalp if he eats cheese, ice cream, but not milk.  02/24/14- 81 yoM followed for Allergic rhinitis, hx OSA FOLLOWS FOR: states that he needs his cpap 8/ Advanced replaced per his dme, [facility].  states that dme company also told him he needed a new sleep study every 10 years. States the card that reads the machine is malfunctioning.  Has been using CPAP every night  Continues allergy vaccine 1:10 GH. Persistent nasal stuffiness without much sneezing or drainage. We don't find results from labs drawn November 9.  05/27/14-64 yoM followed for Allergic rhinitis, hx OSA sleep apnea; sleeps approx 7 hrs night; uses CPAP 8/ Advanced; Allergy vaccine 1:10 [place] NPSG 05/13/14- verified obstructive sleep apnea, AHI 11 per hour, body weight 175 pounds. He qualifies for renewal of CPAP, which he very much wants to do. He is needing a replacement machine. Early Spring seasonal nasal stuffiness and drainage is noted, being treated with OTC  antihistamine.  08/03/14- 40 yoM followed for Allergic rhinitis, hx OSA FOLLOWS FOR: Has new CPAP 8/ Advanced machine and wearing for about 6-7 hours; DME is [facility]. Still on Allergy vaccine 1:10 GH and doing well.  We reviewed his CPAP download. Compliance is adequate and control is excellent. Discussed goals. He had some trouble getting a comfortable nasal pillow mask.  02/01/15- 78 yoM followed for Allergic rhinitis, OSA CPAP 8/ Advanced      Allergy vaccine 1:10 GH FOLLOWS [id number] CPAP every night; DME is [facility]. Pt states he is more "swelling like" feelings in his nasal area-continues to use Flonase Nasal spray and allergy vaccine-no reactions. Pt will need order for new face mask. Considers allergy vaccine still very helpful Increased nasal stuffiness in the mornings 2 weeks. This is when the indoor heat has been on and we discussed management of dryness.  ROS-see HPI Constitutional:   No-   weight loss, night sweats, fevers, chills, fatigue, lassitude. HEENT:   No-  headaches, difficulty swallowing, tooth/dental problems, sore throat,       sneezing, itching, ear ache, +nasal congestion, post nasal drip,  CV:  No-   chest pain, orthopnea, PND, swelling in lower extremities, anasarca, dizziness, palpitations Resp: No-   shortness of breath with exertion or at rest.              No-   productive cough,  No non-productive cough,  No- coughing up of blood.              No-   change in color of mucus.  No- wheezing.   Skin: No-  rash or lesions. GI:  No-   heartburn, indigestion, abdominal pain, nausea, vomiting,  GU:  MS:  No-   joint pain or swelling.   Neuro-     nothing unusual Psych:  No- change in mood or affect. No depression or anxiety.  No memory loss.  OBJ- Physical Exam General- Alert, Oriented, Affect-appropriate, Distress- none acute Skin- rash-none, lesions- none, excoriation- none Lymphadenopathy- none Head- atraumatic            Eyes- Gross vision intact, PERRLA,  conjunctivae and secretions clear            Ears- Hearing, canals-normal            Nose-  turbinate edema +, no-Septal dev, no-mucus bridging, no-polyps, erosion, perforation             Throat- Mallampati III , mucosa clear , drainage- none, tonsils- atrophic Neck- flexible , trachea midline, no stridor , thyroid nl, carotid no bruit Chest - symmetrical excursion , unlabored           Heart/CV- RRR , no murmur , no gallop  , no rub, nl s1 s2                           - JVD- none , edema- none, stasis changes- none, varices- none           Lung- clear to P&A, wheeze- none, cough- none , dullness-none, rub- none           Chest wall-  Abd-  Br/ Gen/ Rectal- Not done, not indicated Extrem- cyanosis- none, clubbing, none, atrophy- none, strength- nl Neuro- grossly intact to observation

## 2015-02-01 NOTE — Assessment & Plan Note (Signed)
Good compliance and control still at 8 CWP. Plan-adjust humidifier and add nasal saline gel as needed for dry nose

## 2015-02-01 NOTE — Patient Instructions (Addendum)
If you still need one, we can give your flu shot when you come for your allergy shot  We can continue allergy vaccine 1:10  Ok to continue CPAP 8. Try turning up the humidifier a little for comfort. Your DME company can help you do that if needed.  Try using otc nasal saline spray at bedtime and whenever you wake feeling dry and stuffy to see if it helps.

## 2015-02-01 NOTE — Assessment & Plan Note (Signed)
Okay to continue allergy vaccine for another year. Discussed allergic and nonallergic rhinitis.

## 2015-02-08 ENCOUNTER — Ambulatory Visit (INDEPENDENT_AMBULATORY_CARE_PROVIDER_SITE_OTHER): Payer: Medicare Other

## 2015-02-08 DIAGNOSIS — J309 Allergic rhinitis, unspecified: Secondary | ICD-10-CM

## 2015-02-15 ENCOUNTER — Ambulatory Visit (INDEPENDENT_AMBULATORY_CARE_PROVIDER_SITE_OTHER): Payer: Medicare Other

## 2015-02-15 DIAGNOSIS — Z23 Encounter for immunization: Secondary | ICD-10-CM

## 2015-02-15 DIAGNOSIS — J309 Allergic rhinitis, unspecified: Secondary | ICD-10-CM

## 2015-02-17 DIAGNOSIS — Z23 Encounter for immunization: Secondary | ICD-10-CM | POA: Diagnosis not present

## 2015-02-22 ENCOUNTER — Ambulatory Visit (INDEPENDENT_AMBULATORY_CARE_PROVIDER_SITE_OTHER): Payer: Medicare Other

## 2015-02-22 DIAGNOSIS — J309 Allergic rhinitis, unspecified: Secondary | ICD-10-CM | POA: Diagnosis not present

## 2015-03-01 ENCOUNTER — Ambulatory Visit (INDEPENDENT_AMBULATORY_CARE_PROVIDER_SITE_OTHER): Payer: Medicare Other

## 2015-03-01 DIAGNOSIS — J309 Allergic rhinitis, unspecified: Secondary | ICD-10-CM

## 2015-03-08 ENCOUNTER — Ambulatory Visit (INDEPENDENT_AMBULATORY_CARE_PROVIDER_SITE_OTHER): Payer: Medicare Other

## 2015-03-08 ENCOUNTER — Encounter: Payer: Self-pay | Admitting: Internal Medicine

## 2015-03-08 DIAGNOSIS — J309 Allergic rhinitis, unspecified: Secondary | ICD-10-CM

## 2015-03-19 ENCOUNTER — Ambulatory Visit (INDEPENDENT_AMBULATORY_CARE_PROVIDER_SITE_OTHER): Payer: Medicare Other

## 2015-03-19 DIAGNOSIS — J309 Allergic rhinitis, unspecified: Secondary | ICD-10-CM

## 2015-03-26 ENCOUNTER — Ambulatory Visit (INDEPENDENT_AMBULATORY_CARE_PROVIDER_SITE_OTHER): Payer: Medicare Other

## 2015-03-26 DIAGNOSIS — J309 Allergic rhinitis, unspecified: Secondary | ICD-10-CM

## 2015-03-29 ENCOUNTER — Encounter: Admit: 2015-03-29 | Primary: Emergency Medicine

## 2015-03-29 ENCOUNTER — Encounter: Primary: Emergency Medicine

## 2015-03-29 ENCOUNTER — Inpatient Hospital Stay: Admit: 2015-03-29 | Attending: Emergency Medicine | Primary: Emergency Medicine

## 2015-03-29 ENCOUNTER — Encounter: Admit: 2015-03-29 | Discharge: 2015-05-17 | Payer: MEDICARE | Primary: Emergency Medicine

## 2015-03-29 ENCOUNTER — Encounter

## 2015-03-29 DIAGNOSIS — R9431 Abnormal electrocardiogram [ECG] [EKG]: Secondary | ICD-10-CM

## 2015-03-29 LAB — ECHOCARDIOGRAM COMPLETE 2D W DOPPLER W COLOR: Left Ventricular Ejection Fraction: 58

## 2015-03-29 MED ORDER — METOPROLOL TARTRATE 1 MG/ML IV SOLN
1 MG/ML | INTRAVENOUS | Status: DC | PRN
Start: 2015-03-29 — End: 2015-03-30

## 2015-03-29 MED ORDER — NITROGLYCERIN 0.4 MG SL SUBL
0.4 MG | SUBLINGUAL | Status: DC | PRN
Start: 2015-03-29 — End: 2015-03-30

## 2015-03-29 MED ORDER — SODIUM CHLORIDE 0.9% INTERMITTENT INFUSION
0.9 % | Freq: Once | INTRAVENOUS | Status: AC
Start: 2015-03-29 — End: 2015-03-29
  Administered 2015-03-29: 14:00:00 250 mL via INTRAVENOUS

## 2015-03-29 MED ORDER — TECHNETIUM TC 99M SESTAMIBI - CARDIOLITE
Freq: Once | Status: AC | PRN
Start: 2015-03-29 — End: 2015-03-29
  Administered 2015-03-29: 12:00:00 10.74 via INTRAVENOUS

## 2015-03-29 MED ORDER — NORMAL SALINE FLUSH 0.9 % IV SOLN
0.9 % | INTRAVENOUS | Status: DC | PRN
Start: 2015-03-29 — End: 2015-03-30

## 2015-03-29 MED ORDER — TECHNETIUM TC 99M SESTAMIBI - CARDIOLITE
Freq: Once | Status: AC | PRN
Start: 2015-03-29 — End: 2015-03-29
  Administered 2015-03-29: 14:00:00 35 via INTRAVENOUS

## 2015-03-29 NOTE — Procedures (Signed)
[facility] - [address] [facility]                     [address] [place], [place] [id number]                                [phone]                       Cardiovascular Diagnostics Department    Patient Name: Lenise HeraldNDERSON, Kalix D                 Exam Date: 03/29/2015  CI Number: [id number]                             Document ID: [id number]  Visit Number: [id number]                        Age: [age]  MR Number: [id number]                              DOB: [birth year]  Ordering Physician: Blondell RevealNeiman T Odeh, D.O.           Gender: M  PCP: Blondell RevealNeiman T Odeh, D.O.                          Room: OP  Attending Physician: Blondell RevealNeiman T Odeh, D.O.          Location: OP      TEST TYPE: MYOVIEW TREADMILL STRESS STUDY  procedure explained/consent given    INDICATION FOR STUDY:  ELECTROCARDIOGRAM ABNORMALITIES    INTERPRETATION  100% MAX PREDICTED HR:  155 bpm.  85% OF MAX PREDICTED HR: 131 bpm.  RESTING HEART RATE: 74 bpm.  MAXIMUM HR ACHIEVED: 137 bpm    EQUAL TO 88% OF AGE PREDICTED MAXIMUM.  RESTING BLOOD PRESSURE:  112/65.  PEAK BLOOD PRESSURE:   165/65.  REASON FOR TERMINATION:  85 MAXIMUM PREDICTED HEART RATE ACHIEVED.  PROTOCOL:  BRUCE.  METS:  13.4.  DURATION:  11 MINUTES AND 00 SEONDS.  RESTING ECG:  ABNORMAL. CONDUCTION ABNORMALITY.  STRESS HEART RESPONSE:  NORMAL RESPONSE.  STRESS BP RESPONSE:  APPROPRIATE.  STRESS EKG CHANGES:  NORMAL.  CHEST DISCOMFORT:  NO PAIN DURING STRESS.  ISCHEMIC EKG CHANGES: UNINTERPRETABLE.    FINAL IMPRESSION:  Electrocardiographically UNINTERPRETABLE Myoview treadmill stress study.          "In order to promptly notify physicians concerning their patients, this  document is being released. It is not considered final until the  physician's authentication is found below."      Nonah Mattesaza Mervin Ramires, M.D. lis  Dict: 03/29/2015  Trans: 03/29/2015 10:00 A    cc:

## 2015-03-29 NOTE — Progress Notes (Signed)
cst complete  Pt tolerated well

## 2015-04-07 LAB — GLUCOSE, RANDOM: Glucose: 88 mg/dL (ref 70–99)

## 2015-04-07 LAB — ELECTROLYTE PANEL
Anion Gap: 11 mmol/L (ref 9–17)
CO2: 29 mmol/L (ref 20–31)
Chloride: 101 mmol/L (ref 98–107)
Potassium: 4.7 mmol/L (ref 3.7–5.3)
Sodium: 141 mmol/L (ref 135–144)

## 2015-04-07 LAB — HEPATIC FUNCTION PANEL
ALT: 23 U/L (ref 5–41)
AST: 19 U/L (ref <40)
Albumin/Globulin Ratio: 1.9 (ref 1.0–2.5)
Albumin: 4.4 g/dL (ref 3.5–5.2)
Alkaline Phosphatase: 117 U/L (ref 40–129)
Bilirubin, Direct: 0.09 mg/dL (ref <0.31)
Bilirubin, Indirect: 0.31 mg/dL (ref 0.00–1.00)
Total Bilirubin: 0.4 mg/dL (ref 0.3–1.2)
Total Protein: 6.7 g/dL (ref 6.4–8.3)

## 2015-04-07 LAB — LIPID PANEL
Chol/HDL Ratio: 4.9 (ref <5)
Cholesterol: 191 mg/dL (ref <200)
HDL: 39 mg/dL — ABNORMAL LOW (ref >40)
LDL Cholesterol: 110 mg/dL (ref 0–130)
Triglycerides: 209 mg/dL — ABNORMAL HIGH (ref <150)

## 2015-04-07 LAB — PSA SCREENING: PSA: 0.95 ug/L (ref <4.1)

## 2015-04-07 LAB — T4, FREE: Thyroxine, Free: 1.11 ng/dL (ref 0.93–1.70)

## 2015-04-07 LAB — CBC WITH AUTO DIFFERENTIAL
Absolute Eos #: 0.3 10*3/uL (ref 0.0–0.4)
Absolute Lymph #: 3.1 10*3/uL (ref 1.0–4.8)
Absolute Mono #: 0.8 10*3/uL (ref 0.1–1.2)
Basophils Absolute: 0.1 10*3/uL (ref 0.0–0.2)
Basophils: 1 % (ref 0–2)
Eosinophils %: 3 % (ref 1–4)
Hematocrit: 42.9 % (ref 41–53)
Hemoglobin: 14.6 g/dL (ref 13.5–17.5)
Lymphocytes: 36 % (ref 24–44)
MCH: 29.9 pg (ref 26–34)
MCHC: 33.9 g/dL (ref 31–37)
MCV: 88.3 fL (ref 80–100)
MPV: 9 fL (ref 6.0–12.0)
Monocytes: 9 % (ref 2–11)
Platelets: 292 10*3/uL (ref 140–450)
RBC: 4.86 m/uL (ref 4.5–5.9)
RDW: 13.5 % (ref 12.5–15.4)
Seg Neutrophils: 51 % (ref 36–66)
Segs Absolute: 4.4 10*3/uL (ref 1.8–7.7)
WBC: 8.6 10*3/uL (ref 3.5–11.0)

## 2015-04-07 LAB — CREATININE
Creatinine: 1.02 mg/dL (ref 0.70–1.20)
GFR African American: >60 mL/min (ref >60)
GFR Non-African American: >60 mL/min (ref >60)

## 2015-04-07 LAB — VITAMIN D 25 HYDROXY: Vit D, 25-Hydroxy: 19.9 ng/mL — ABNORMAL LOW (ref 30.0–100.0)

## 2015-04-07 LAB — BUN: BUN: 14 mg/dL (ref 8–23)

## 2015-04-07 LAB — TSH: TSH: 2.94 mIU/L (ref 0.30–5.00)

## 2015-04-09 ENCOUNTER — Ambulatory Visit (INDEPENDENT_AMBULATORY_CARE_PROVIDER_SITE_OTHER): Payer: Medicare Other

## 2015-04-09 DIAGNOSIS — J309 Allergic rhinitis, unspecified: Secondary | ICD-10-CM | POA: Diagnosis not present

## 2015-04-13 ENCOUNTER — Ambulatory Visit (INDEPENDENT_AMBULATORY_CARE_PROVIDER_SITE_OTHER): Payer: Medicare Other

## 2015-04-13 ENCOUNTER — Telehealth: Payer: Self-pay | Admitting: Internal Medicine

## 2015-04-13 DIAGNOSIS — J309 Allergic rhinitis, unspecified: Secondary | ICD-10-CM

## 2015-04-13 NOTE — Telephone Encounter (Signed)
Allergy Serum Extract Date Mixed: 04/12/14 Vial: 2 Strength: 1:10 Here/Mail/Pick Up: here Mixed By: tbs Last OV: 05/27/14 Pending OV: 08/02/15

## 2015-04-14 ENCOUNTER — Ambulatory Visit: Payer: Medicare Other

## 2015-04-26 ENCOUNTER — Ambulatory Visit (INDEPENDENT_AMBULATORY_CARE_PROVIDER_SITE_OTHER): Payer: Medicare Other

## 2015-04-26 DIAGNOSIS — J309 Allergic rhinitis, unspecified: Secondary | ICD-10-CM

## 2015-05-03 ENCOUNTER — Ambulatory Visit (INDEPENDENT_AMBULATORY_CARE_PROVIDER_SITE_OTHER): Payer: Medicare Other

## 2015-05-03 DIAGNOSIS — J309 Allergic rhinitis, unspecified: Secondary | ICD-10-CM

## 2015-05-10 ENCOUNTER — Ambulatory Visit (INDEPENDENT_AMBULATORY_CARE_PROVIDER_SITE_OTHER): Payer: Medicare Other

## 2015-05-10 DIAGNOSIS — J309 Allergic rhinitis, unspecified: Secondary | ICD-10-CM

## 2015-05-17 ENCOUNTER — Ambulatory Visit: Payer: Medicare Other

## 2015-05-24 DIAGNOSIS — L578 Other skin changes due to chronic exposure to nonionizing radiation: Secondary | ICD-10-CM | POA: Diagnosis not present

## 2015-05-24 DIAGNOSIS — L570 Actinic keratosis: Secondary | ICD-10-CM | POA: Diagnosis not present

## 2015-06-09 ENCOUNTER — Ambulatory Visit (INDEPENDENT_AMBULATORY_CARE_PROVIDER_SITE_OTHER): Payer: Medicare Other | Admitting: *Deleted

## 2015-06-09 DIAGNOSIS — J309 Allergic rhinitis, unspecified: Secondary | ICD-10-CM

## 2015-06-14 ENCOUNTER — Ambulatory Visit (INDEPENDENT_AMBULATORY_CARE_PROVIDER_SITE_OTHER): Payer: Medicare Other | Admitting: *Deleted

## 2015-06-14 DIAGNOSIS — J309 Allergic rhinitis, unspecified: Secondary | ICD-10-CM | POA: Diagnosis not present

## 2015-06-21 ENCOUNTER — Ambulatory Visit (INDEPENDENT_AMBULATORY_CARE_PROVIDER_SITE_OTHER): Payer: Medicare Other | Admitting: *Deleted

## 2015-06-21 DIAGNOSIS — J309 Allergic rhinitis, unspecified: Secondary | ICD-10-CM | POA: Diagnosis not present

## 2015-06-28 ENCOUNTER — Ambulatory Visit (INDEPENDENT_AMBULATORY_CARE_PROVIDER_SITE_OTHER): Payer: Medicare Other | Admitting: *Deleted

## 2015-06-28 DIAGNOSIS — J309 Allergic rhinitis, unspecified: Secondary | ICD-10-CM

## 2015-07-06 ENCOUNTER — Ambulatory Visit (INDEPENDENT_AMBULATORY_CARE_PROVIDER_SITE_OTHER): Payer: Medicare Other | Admitting: *Deleted

## 2015-07-06 DIAGNOSIS — J309 Allergic rhinitis, unspecified: Secondary | ICD-10-CM | POA: Diagnosis not present

## 2015-07-13 ENCOUNTER — Ambulatory Visit: Payer: Medicare Other

## 2015-07-15 ENCOUNTER — Ambulatory Visit (INDEPENDENT_AMBULATORY_CARE_PROVIDER_SITE_OTHER): Payer: Medicare Other | Admitting: *Deleted

## 2015-07-15 DIAGNOSIS — J309 Allergic rhinitis, unspecified: Secondary | ICD-10-CM

## 2015-07-19 ENCOUNTER — Ambulatory Visit: Payer: Medicare Other

## 2015-07-22 ENCOUNTER — Ambulatory Visit (INDEPENDENT_AMBULATORY_CARE_PROVIDER_SITE_OTHER): Payer: Medicare Other

## 2015-07-22 DIAGNOSIS — J309 Allergic rhinitis, unspecified: Secondary | ICD-10-CM

## 2015-07-26 ENCOUNTER — Ambulatory Visit: Payer: Medicare Other

## 2015-07-26 ENCOUNTER — Ambulatory Visit (INDEPENDENT_AMBULATORY_CARE_PROVIDER_SITE_OTHER): Payer: Medicare Other | Admitting: *Deleted

## 2015-07-26 DIAGNOSIS — J309 Allergic rhinitis, unspecified: Secondary | ICD-10-CM | POA: Diagnosis not present

## 2015-08-02 ENCOUNTER — Encounter: Payer: Self-pay | Admitting: Internal Medicine

## 2015-08-02 ENCOUNTER — Ambulatory Visit (INDEPENDENT_AMBULATORY_CARE_PROVIDER_SITE_OTHER): Payer: Medicare Other | Admitting: *Deleted

## 2015-08-02 ENCOUNTER — Ambulatory Visit (INDEPENDENT_AMBULATORY_CARE_PROVIDER_SITE_OTHER): Payer: Medicare Other | Admitting: Internal Medicine

## 2015-08-02 VITALS — BP 122/70 | HR 92 | Ht 68.0 in | Wt 173.0 lb

## 2015-08-02 DIAGNOSIS — K9049 Malabsorption due to intolerance, not elsewhere classified: Secondary | ICD-10-CM

## 2015-08-02 DIAGNOSIS — J309 Allergic rhinitis, unspecified: Secondary | ICD-10-CM | POA: Diagnosis not present

## 2015-08-02 DIAGNOSIS — J301 Allergic rhinitis due to pollen: Secondary | ICD-10-CM | POA: Diagnosis not present

## 2015-08-02 DIAGNOSIS — J3489 Other specified disorders of nose and nasal sinuses: Secondary | ICD-10-CM | POA: Diagnosis not present

## 2015-08-02 DIAGNOSIS — G4733 Obstructive sleep apnea (adult) (pediatric): Secondary | ICD-10-CM | POA: Diagnosis not present

## 2015-08-02 NOTE — Progress Notes (Signed)
01/20/13- 9 yoM followed for Allergic rhinitis, hx OSA FOLLOWS FOR: last seen 2012; still on allergy vaccine 1:10 GH and doing well. He chose not to have septoplasty/Dr. Ernesto Rutherford. Gets flu vaccine through his primary physician, Dr. Brigitte Pulse. Spring pollen symptoms are still worse than fall. He supplements with occasional antihistamines in the spring and uses Flonase for intervals as needed. He continues CPAP 8/Advanced all night every night. Machine is old.  01/26/14- 16 yoM followed for Allergic rhinitis, hx OSA FOLLOWS FOR: Still on allergy vaccine 1:10 GH and doing well;  OSA-Continues to wear CPAP 8/ Advanced every night for avg of 6 hours; DME is [facility]-would like to discuss new CPAP machine. Allergy vaccine is still satisfactory. Notices some nasal stuffiness and spring and fall. Discussed supplemental antihistamines and nasal sprays. Food intolerance-food allergy profile had been negative in the past. He notices increased nasal congestion and itching of nose and scalp if he eats cheese, ice cream, but not milk.  02/24/14- 44 yoM followed for Allergic rhinitis, hx OSA FOLLOWS FOR: states that he needs his cpap 8/ Advanced replaced per his dme, [facility].  states that dme company also told him he needed a new sleep study every 10 years. States the card that reads the machine is malfunctioning.  Has been using CPAP every night  Continues allergy vaccine 1:10 GH. Persistent nasal stuffiness without much sneezing or drainage. We don't find results from labs drawn November 9.  05/27/14-64 yoM followed for Allergic rhinitis, hx OSA sleep apnea; sleeps approx 7 hrs night; uses CPAP 8/ Advanced; Allergy vaccine 1:10 [place] NPSG 05/13/14- verified obstructive sleep apnea, AHI 11 per hour, body weight 175 pounds. He qualifies for renewal of CPAP, which he very much wants to do. He is needing a replacement machine. Early Spring seasonal nasal stuffiness and drainage is noted, being treated with OTC  antihistamine.  08/03/14- 65 yoM followed for Allergic rhinitis, hx OSA FOLLOWS FOR: Has new CPAP 8/ Advanced machine and wearing for about 6-7 hours; DME is [facility]. Still on Allergy vaccine 1:10 GH and doing well.  We reviewed his CPAP download. Compliance is adequate and control is excellent. Discussed goals. He had some trouble getting a comfortable nasal pillow mask.  02/01/15- 85 yoM followed for Allergic rhinitis, OSA CPAP 8/ Advanced      Allergy vaccine 1:10 [place] FOLLOWS [id number] CPAP every night; DME is [facility]. Pt states he is more "swelling like" feelings in his nasal area-continues to use Flonase Nasal spray and allergy vaccine-no reactions. Pt will need order for new face mask. Considers allergy vaccine still very helpful Increased nasal stuffiness in the mornings 2 weeks. This is when the indoor heat has been on and we discussed management of dryness.  08/02/2015-65 year old male never smoker followed for Allergic rhinitis, OSA CPAP 8/ Advanced      Allergy vaccine 1:10 [place] FOLLOWS FOR: Still on vaccine-no reactions. Pt states he has issues for about 51minutes of itching and sneezing after eating cheese or dairy products. Pt also has been having hard time using CPAP due to stuffiness-has tried raising head of bed, etc without relief. Takes CPAP off repeatedly in sleep when nose gets stuffy. Using nasal mask and nasal strips. Sudafed at bedtime. History of nasal fracture with surgery 3. PCP Dr. Brigitte Pulse fills his Adderall using 20 mg X are once daily and occasional 10 mg. Download confirms inadequate compliance but excellent control at 8 CWP.  ROS-see HPI Constitutional:   No-   weight loss, night  sweats, fevers, chills, fatigue, lassitude. HEENT:   No-  headaches, difficulty swallowing, tooth/dental problems, sore throat,       sneezing, itching, ear ache, +nasal congestion, post nasal drip,  CV:  No-   chest pain, orthopnea, PND, swelling in lower extremities, anasarca, dizziness,  palpitations Resp: No-   shortness of breath with exertion or at rest.              No-   productive cough,  No non-productive cough,  No- coughing up of blood.              No-   change in color of mucus.  No- wheezing.   Skin: No-   rash or lesions. GI:  No-   heartburn, indigestion, abdominal pain, nausea, vomiting,  GU:  MS:  No-   joint pain or swelling.   Neuro-     nothing unusual Psych:  No- change in mood or affect. No depression or anxiety.  No memory loss.  OBJ- Physical Exam General- Alert, Oriented, Affect-appropriate, Distress- none acute Skin- rash-none, lesions- none, excoriation- none Lymphadenopathy- none Head- atraumatic            Eyes- Gross vision intact, PERRLA, conjunctivae and secretions clear            Ears- Hearing, canals-normal            Nose-  turbinate edema +, no-Septal dev, no-mucus bridging, no-polyps, erosion, perforation             Throat- Mallampati III , mucosa clear , drainage- none, tonsils- atrophic Neck- flexible , trachea midline, no stridor , thyroid nl, carotid no bruit Chest - symmetrical excursion , unlabored           Heart/CV- RRR , no murmur , no gallop  , no rub, nl s1 s2                           - JVD- none , edema- none, stasis changes- none, varices- none           Lung- clear to P&A, wheeze- none, cough- none , dullness-none, rub- none           Chest wall-  Abd-  Br/ Gen/ Rectal- Not done, not indicated Extrem- cyanosis- none, clubbing, none, atrophy- none, strength- nl Neuro- grossly intact to observation

## 2015-08-02 NOTE — Patient Instructions (Signed)
Order- referral to [facility]   Evaluate for chronic nasal obstruction  Ok to continue CPAP 8. Our goal is to use it whenever you are sleeping, as much of the time as possible.

## 2015-08-05 ENCOUNTER — Encounter: Payer: Self-pay | Admitting: Internal Medicine

## 2015-08-09 NOTE — Assessment & Plan Note (Signed)
Watch for possible Oral Allergy syndrome with cross reaction between certain foods and certain pollens.

## 2015-08-09 NOTE — Assessment & Plan Note (Signed)
Adequate control of allergic rhinitis. The main problem is a more fixed structural nasal stuffiness by his description. Plan-we can continue allergy vaccine through this year as discussed.

## 2015-08-09 NOTE — Assessment & Plan Note (Signed)
Need to improve compliance if possible. Pressure is sufficient at 8 with good control demonstrated on download. He says the main problem is difficulty breathing through his nose at night. He has had previous nasal surgery but reevaluation now makes sense to him. Plan-ENT referral.

## 2015-08-10 ENCOUNTER — Ambulatory Visit (INDEPENDENT_AMBULATORY_CARE_PROVIDER_SITE_OTHER): Payer: Medicare Other | Admitting: *Deleted

## 2015-08-10 DIAGNOSIS — J309 Allergic rhinitis, unspecified: Secondary | ICD-10-CM

## 2015-08-17 ENCOUNTER — Ambulatory Visit: Payer: Medicare Other

## 2015-08-19 DIAGNOSIS — J343 Hypertrophy of nasal turbinates: Secondary | ICD-10-CM | POA: Diagnosis not present

## 2015-08-19 DIAGNOSIS — G4733 Obstructive sleep apnea (adult) (pediatric): Secondary | ICD-10-CM | POA: Diagnosis not present

## 2015-08-20 ENCOUNTER — Ambulatory Visit (INDEPENDENT_AMBULATORY_CARE_PROVIDER_SITE_OTHER): Payer: Medicare Other | Admitting: *Deleted

## 2015-08-20 DIAGNOSIS — J309 Allergic rhinitis, unspecified: Secondary | ICD-10-CM | POA: Diagnosis not present

## 2015-08-23 ENCOUNTER — Ambulatory Visit (INDEPENDENT_AMBULATORY_CARE_PROVIDER_SITE_OTHER): Payer: Medicare Other | Admitting: *Deleted

## 2015-08-23 DIAGNOSIS — J309 Allergic rhinitis, unspecified: Secondary | ICD-10-CM

## 2015-09-07 ENCOUNTER — Ambulatory Visit (INDEPENDENT_AMBULATORY_CARE_PROVIDER_SITE_OTHER): Payer: Medicare Other | Admitting: *Deleted

## 2015-09-07 DIAGNOSIS — J309 Allergic rhinitis, unspecified: Secondary | ICD-10-CM | POA: Diagnosis not present

## 2015-09-20 ENCOUNTER — Ambulatory Visit (INDEPENDENT_AMBULATORY_CARE_PROVIDER_SITE_OTHER): Payer: Medicare Other | Admitting: *Deleted

## 2015-09-20 DIAGNOSIS — J309 Allergic rhinitis, unspecified: Secondary | ICD-10-CM

## 2015-09-22 ENCOUNTER — Telehealth: Payer: Self-pay | Admitting: Internal Medicine

## 2015-09-22 DIAGNOSIS — J309 Allergic rhinitis, unspecified: Secondary | ICD-10-CM | POA: Diagnosis not present

## 2015-09-22 NOTE — Telephone Encounter (Signed)
Allergy Serum Extract Date Mixed: 09/22/15 Vial: 2 Strength: 1:10 Here/Mail/Pick Up: here Mixed By: tbs Last OV: 05/27/14 Pending OV: 08/02/15

## 2015-10-01 ENCOUNTER — Ambulatory Visit (INDEPENDENT_AMBULATORY_CARE_PROVIDER_SITE_OTHER): Payer: Medicare Other

## 2015-10-01 DIAGNOSIS — J309 Allergic rhinitis, unspecified: Secondary | ICD-10-CM | POA: Diagnosis not present

## 2015-10-04 ENCOUNTER — Ambulatory Visit (INDEPENDENT_AMBULATORY_CARE_PROVIDER_SITE_OTHER): Payer: Medicare Other | Admitting: *Deleted

## 2015-10-04 DIAGNOSIS — J309 Allergic rhinitis, unspecified: Secondary | ICD-10-CM | POA: Diagnosis not present

## 2015-10-11 ENCOUNTER — Ambulatory Visit: Payer: Medicare Other

## 2015-10-15 ENCOUNTER — Ambulatory Visit (INDEPENDENT_AMBULATORY_CARE_PROVIDER_SITE_OTHER): Payer: Medicare Other | Admitting: *Deleted

## 2015-10-15 DIAGNOSIS — J309 Allergic rhinitis, unspecified: Secondary | ICD-10-CM | POA: Diagnosis not present

## 2015-10-29 DIAGNOSIS — I10 Essential (primary) hypertension: Secondary | ICD-10-CM | POA: Diagnosis not present

## 2015-10-29 DIAGNOSIS — Z125 Encounter for screening for malignant neoplasm of prostate: Secondary | ICD-10-CM | POA: Diagnosis not present

## 2015-10-29 DIAGNOSIS — R7301 Impaired fasting glucose: Secondary | ICD-10-CM | POA: Diagnosis not present

## 2015-10-29 DIAGNOSIS — E784 Other hyperlipidemia: Secondary | ICD-10-CM | POA: Diagnosis not present

## 2015-11-04 ENCOUNTER — Ambulatory Visit (INDEPENDENT_AMBULATORY_CARE_PROVIDER_SITE_OTHER): Payer: Medicare Other | Admitting: *Deleted

## 2015-11-04 DIAGNOSIS — J309 Allergic rhinitis, unspecified: Secondary | ICD-10-CM

## 2015-11-08 ENCOUNTER — Ambulatory Visit: Payer: Medicare Other

## 2015-11-17 DIAGNOSIS — Z0000 Encounter for general adult medical examination without abnormal findings: Secondary | ICD-10-CM | POA: Diagnosis not present

## 2015-11-17 DIAGNOSIS — N401 Enlarged prostate with lower urinary tract symptoms: Secondary | ICD-10-CM | POA: Diagnosis not present

## 2015-11-17 DIAGNOSIS — R7301 Impaired fasting glucose: Secondary | ICD-10-CM | POA: Diagnosis not present

## 2015-11-17 DIAGNOSIS — E784 Other hyperlipidemia: Secondary | ICD-10-CM | POA: Diagnosis not present

## 2015-11-17 DIAGNOSIS — I10 Essential (primary) hypertension: Secondary | ICD-10-CM | POA: Diagnosis not present

## 2015-11-17 DIAGNOSIS — R740 Nonspecific elevation of levels of transaminase and lactic acid dehydrogenase [LDH]: Secondary | ICD-10-CM | POA: Diagnosis not present

## 2015-11-17 DIAGNOSIS — F900 Attention-deficit hyperactivity disorder, predominantly inattentive type: Secondary | ICD-10-CM | POA: Diagnosis not present

## 2015-11-17 DIAGNOSIS — Z23 Encounter for immunization: Secondary | ICD-10-CM | POA: Diagnosis not present

## 2015-11-17 DIAGNOSIS — G4733 Obstructive sleep apnea (adult) (pediatric): Secondary | ICD-10-CM | POA: Diagnosis not present

## 2015-11-17 DIAGNOSIS — Z6826 Body mass index (BMI) 26.0-26.9, adult: Secondary | ICD-10-CM | POA: Diagnosis not present

## 2015-11-17 DIAGNOSIS — N3941 Urge incontinence: Secondary | ICD-10-CM | POA: Diagnosis not present

## 2015-11-24 DIAGNOSIS — G629 Polyneuropathy, unspecified: Secondary | ICD-10-CM | POA: Diagnosis not present

## 2015-11-24 DIAGNOSIS — G6289 Other specified polyneuropathies: Secondary | ICD-10-CM | POA: Diagnosis not present

## 2015-11-26 ENCOUNTER — Ambulatory Visit (INDEPENDENT_AMBULATORY_CARE_PROVIDER_SITE_OTHER): Payer: Medicare Other | Admitting: *Deleted

## 2015-11-26 DIAGNOSIS — J309 Allergic rhinitis, unspecified: Secondary | ICD-10-CM

## 2015-11-29 ENCOUNTER — Ambulatory Visit (INDEPENDENT_AMBULATORY_CARE_PROVIDER_SITE_OTHER): Payer: Medicare Other | Admitting: *Deleted

## 2015-11-29 DIAGNOSIS — J309 Allergic rhinitis, unspecified: Secondary | ICD-10-CM

## 2015-12-06 ENCOUNTER — Ambulatory Visit (INDEPENDENT_AMBULATORY_CARE_PROVIDER_SITE_OTHER): Payer: Medicare Other

## 2015-12-06 ENCOUNTER — Ambulatory Visit (INDEPENDENT_AMBULATORY_CARE_PROVIDER_SITE_OTHER): Payer: Medicare Other | Admitting: *Deleted

## 2015-12-06 DIAGNOSIS — Z23 Encounter for immunization: Secondary | ICD-10-CM

## 2015-12-06 DIAGNOSIS — J309 Allergic rhinitis, unspecified: Secondary | ICD-10-CM

## 2015-12-13 ENCOUNTER — Ambulatory Visit (INDEPENDENT_AMBULATORY_CARE_PROVIDER_SITE_OTHER): Payer: Medicare Other | Admitting: *Deleted

## 2015-12-13 DIAGNOSIS — J309 Allergic rhinitis, unspecified: Secondary | ICD-10-CM

## 2015-12-20 ENCOUNTER — Ambulatory Visit: Payer: Medicare Other

## 2015-12-29 ENCOUNTER — Ambulatory Visit (INDEPENDENT_AMBULATORY_CARE_PROVIDER_SITE_OTHER): Payer: Medicare Other | Admitting: *Deleted

## 2015-12-29 DIAGNOSIS — J309 Allergic rhinitis, unspecified: Secondary | ICD-10-CM | POA: Diagnosis not present

## 2016-01-03 ENCOUNTER — Ambulatory Visit (INDEPENDENT_AMBULATORY_CARE_PROVIDER_SITE_OTHER): Payer: Medicare Other

## 2016-01-03 DIAGNOSIS — J309 Allergic rhinitis, unspecified: Secondary | ICD-10-CM | POA: Diagnosis not present

## 2016-01-04 DIAGNOSIS — R740 Nonspecific elevation of levels of transaminase and lactic acid dehydrogenase [LDH]: Secondary | ICD-10-CM | POA: Diagnosis not present

## 2016-01-10 ENCOUNTER — Ambulatory Visit (INDEPENDENT_AMBULATORY_CARE_PROVIDER_SITE_OTHER): Payer: Medicare Other | Admitting: *Deleted

## 2016-01-10 DIAGNOSIS — J309 Allergic rhinitis, unspecified: Secondary | ICD-10-CM | POA: Diagnosis not present

## 2016-01-12 DIAGNOSIS — N401 Enlarged prostate with lower urinary tract symptoms: Secondary | ICD-10-CM | POA: Diagnosis not present

## 2016-01-12 DIAGNOSIS — R3914 Feeling of incomplete bladder emptying: Secondary | ICD-10-CM | POA: Diagnosis not present

## 2016-01-17 ENCOUNTER — Ambulatory Visit (INDEPENDENT_AMBULATORY_CARE_PROVIDER_SITE_OTHER): Payer: Medicare Other | Admitting: *Deleted

## 2016-01-17 DIAGNOSIS — J309 Allergic rhinitis, unspecified: Secondary | ICD-10-CM

## 2016-01-26 ENCOUNTER — Ambulatory Visit: Payer: Medicare Other

## 2016-02-04 ENCOUNTER — Ambulatory Visit (INDEPENDENT_AMBULATORY_CARE_PROVIDER_SITE_OTHER): Payer: Medicare Other | Admitting: *Deleted

## 2016-02-04 DIAGNOSIS — J309 Allergic rhinitis, unspecified: Secondary | ICD-10-CM

## 2016-02-07 ENCOUNTER — Ambulatory Visit: Payer: Medicare Other

## 2016-02-16 ENCOUNTER — Ambulatory Visit (INDEPENDENT_AMBULATORY_CARE_PROVIDER_SITE_OTHER): Payer: Medicare Other | Admitting: *Deleted

## 2016-02-16 DIAGNOSIS — J309 Allergic rhinitis, unspecified: Secondary | ICD-10-CM

## 2016-02-21 ENCOUNTER — Ambulatory Visit (INDEPENDENT_AMBULATORY_CARE_PROVIDER_SITE_OTHER): Payer: Medicare Other | Admitting: *Deleted

## 2016-02-21 DIAGNOSIS — J309 Allergic rhinitis, unspecified: Secondary | ICD-10-CM

## 2016-02-28 ENCOUNTER — Ambulatory Visit (INDEPENDENT_AMBULATORY_CARE_PROVIDER_SITE_OTHER): Payer: Medicare Other | Admitting: *Deleted

## 2016-02-28 DIAGNOSIS — J309 Allergic rhinitis, unspecified: Secondary | ICD-10-CM | POA: Diagnosis not present

## 2016-03-06 ENCOUNTER — Ambulatory Visit: Payer: Medicare Other

## 2016-03-08 DIAGNOSIS — N401 Enlarged prostate with lower urinary tract symptoms: Secondary | ICD-10-CM | POA: Diagnosis not present

## 2016-03-08 DIAGNOSIS — R3914 Feeling of incomplete bladder emptying: Secondary | ICD-10-CM | POA: Diagnosis not present

## 2016-03-28 ENCOUNTER — Telehealth: Payer: Self-pay | Admitting: Internal Medicine

## 2016-05-03 NOTE — Telephone Encounter (Signed)
Allergy lab closed and TS has been working with patients. Nothing more needed in this phone note.

## 2016-10-12 LAB — HEPATIC FUNCTION PANEL
ALT: 18 U/L (ref 5–41)
AST: 17 U/L (ref <40)
Albumin/Globulin Ratio: 2 (ref 1.0–2.5)
Albumin: 4.7 g/dL (ref 3.5–5.2)
Alkaline Phosphatase: 133 U/L — ABNORMAL HIGH (ref 40–129)
Bilirubin, Direct: 0.11 mg/dL (ref <0.31)
Bilirubin, Indirect: 0.3 mg/dL (ref 0.00–1.00)
Total Bilirubin: 0.41 mg/dL (ref 0.3–1.2)
Total Protein: 7 g/dL (ref 6.4–8.3)

## 2016-10-12 LAB — ELECTROLYTE PANEL
Anion Gap: 10 mmol/L (ref 9–17)
CO2: 28 mmol/L (ref 20–31)
Chloride: 100 mmol/L (ref 98–107)
Potassium: 4.9 mmol/L (ref 3.7–5.3)
Sodium: 138 mmol/L (ref 135–144)

## 2016-10-12 LAB — CBC WITH AUTO DIFFERENTIAL
Absolute Eos #: 0.16 10*3/uL (ref 0.00–0.44)
Absolute Immature Granulocyte: 0.04 10*3/uL (ref 0.00–0.30)
Absolute Lymph #: 2.81 10*3/uL (ref 1.10–3.70)
Absolute Mono #: 0.74 10*3/uL (ref 0.10–1.20)
Basophils Absolute: 0.1 10*3/uL (ref 0.00–0.20)
Basophils: 1 % (ref 0–2)
Eosinophils %: 2 % (ref 1–4)
Hematocrit: 42.7 % (ref 40.7–50.3)
Hemoglobin: 14 g/dL (ref 13.0–17.0)
Immature Granulocytes: 1 % — ABNORMAL HIGH
Lymphocytes: 34 % (ref 24–43)
MCH: 30.3 pg (ref 25.2–33.5)
MCHC: 32.8 g/dL (ref 28.4–34.8)
MCV: 92.4 fL (ref 82.6–102.9)
MPV: 10.7 fL (ref 8.1–13.5)
Monocytes: 9 % (ref 3–12)
NRBC Automated: 0 per 100 WBC
Platelets: 314 10*3/uL (ref 138–453)
RBC: 4.62 m/uL (ref 4.21–5.77)
RDW: 12.2 % (ref 11.8–14.4)
Seg Neutrophils: 53 % (ref 36–65)
Segs Absolute: 4.32 10*3/uL (ref 1.50–8.10)
WBC: 8.2 10*3/uL (ref 3.5–11.3)

## 2016-10-12 LAB — LIPID, FASTING
Chol/HDL Ratio: 3.8 (ref <5)
Cholesterol, Fasting: 170 mg/dL (ref <200)
HDL: 45 mg/dL (ref >40)
LDL Cholesterol: 100 mg/dL (ref 0–130)
Triglyceride, Fasting: 123 mg/dL (ref <150)

## 2016-10-12 LAB — TSH WITH REFLEX: TSH: 2.42 mIU/L (ref 0.30–5.00)

## 2016-10-12 LAB — BUN & CREATININE
BUN: 12 mg/dL (ref 8–23)
Creatinine: 0.95 mg/dL (ref 0.70–1.20)
GFR African American: >60 mL/min (ref >60)
GFR Non-African American: >60 mL/min (ref >60)

## 2016-10-12 LAB — GLUCOSE, FASTING: Glucose, Fasting: 92 mg/dL (ref 70–99)

## 2016-10-13 LAB — PSA SCREENING: PSA: 1.45 ug/L (ref <4.1)

## 2016-10-13 LAB — VITAMIN D 25 HYDROXY: Vit D, 25-Hydroxy: 34.1 ng/mL (ref 30.0–100.0)

## 2016-11-16 DIAGNOSIS — E784 Other hyperlipidemia: Secondary | ICD-10-CM | POA: Diagnosis not present

## 2016-11-16 DIAGNOSIS — Z125 Encounter for screening for malignant neoplasm of prostate: Secondary | ICD-10-CM | POA: Diagnosis not present

## 2016-11-16 DIAGNOSIS — I10 Essential (primary) hypertension: Secondary | ICD-10-CM | POA: Diagnosis not present

## 2016-11-16 DIAGNOSIS — R7301 Impaired fasting glucose: Secondary | ICD-10-CM | POA: Diagnosis not present

## 2016-11-23 DIAGNOSIS — Z23 Encounter for immunization: Secondary | ICD-10-CM | POA: Diagnosis not present

## 2016-11-23 DIAGNOSIS — E784 Other hyperlipidemia: Secondary | ICD-10-CM | POA: Diagnosis not present

## 2016-11-23 DIAGNOSIS — R740 Nonspecific elevation of levels of transaminase and lactic acid dehydrogenase [LDH]: Secondary | ICD-10-CM | POA: Diagnosis not present

## 2016-11-23 DIAGNOSIS — N401 Enlarged prostate with lower urinary tract symptoms: Secondary | ICD-10-CM | POA: Diagnosis not present

## 2016-11-23 DIAGNOSIS — Z1389 Encounter for screening for other disorder: Secondary | ICD-10-CM | POA: Diagnosis not present

## 2016-11-23 DIAGNOSIS — G4733 Obstructive sleep apnea (adult) (pediatric): Secondary | ICD-10-CM | POA: Diagnosis not present

## 2016-11-23 DIAGNOSIS — Z0000 Encounter for general adult medical examination without abnormal findings: Secondary | ICD-10-CM | POA: Diagnosis not present

## 2016-11-23 DIAGNOSIS — R7301 Impaired fasting glucose: Secondary | ICD-10-CM | POA: Diagnosis not present

## 2016-11-23 DIAGNOSIS — Z6827 Body mass index (BMI) 27.0-27.9, adult: Secondary | ICD-10-CM | POA: Diagnosis not present

## 2016-11-23 DIAGNOSIS — I10 Essential (primary) hypertension: Secondary | ICD-10-CM | POA: Diagnosis not present

## 2016-11-23 DIAGNOSIS — F900 Attention-deficit hyperactivity disorder, predominantly inattentive type: Secondary | ICD-10-CM | POA: Diagnosis not present

## 2016-11-23 DIAGNOSIS — G6289 Other specified polyneuropathies: Secondary | ICD-10-CM | POA: Diagnosis not present

## 2017-03-05 DIAGNOSIS — L570 Actinic keratosis: Secondary | ICD-10-CM | POA: Diagnosis not present

## 2017-03-05 DIAGNOSIS — C44222 Squamous cell carcinoma of skin of right ear and external auricular canal: Secondary | ICD-10-CM | POA: Diagnosis not present

## 2017-03-21 DIAGNOSIS — H2513 Age-related nuclear cataract, bilateral: Secondary | ICD-10-CM | POA: Diagnosis not present

## 2017-11-28 DIAGNOSIS — R82998 Other abnormal findings in urine: Secondary | ICD-10-CM | POA: Diagnosis not present

## 2017-11-28 DIAGNOSIS — Z125 Encounter for screening for malignant neoplasm of prostate: Secondary | ICD-10-CM | POA: Diagnosis not present

## 2017-11-28 DIAGNOSIS — E7849 Other hyperlipidemia: Secondary | ICD-10-CM | POA: Diagnosis not present

## 2017-11-28 DIAGNOSIS — R7301 Impaired fasting glucose: Secondary | ICD-10-CM | POA: Diagnosis not present

## 2017-11-28 DIAGNOSIS — I10 Essential (primary) hypertension: Secondary | ICD-10-CM | POA: Diagnosis not present

## 2017-12-05 DIAGNOSIS — G6289 Other specified polyneuropathies: Secondary | ICD-10-CM | POA: Diagnosis not present

## 2017-12-05 DIAGNOSIS — Z6826 Body mass index (BMI) 26.0-26.9, adult: Secondary | ICD-10-CM | POA: Diagnosis not present

## 2017-12-05 DIAGNOSIS — R7301 Impaired fasting glucose: Secondary | ICD-10-CM | POA: Diagnosis not present

## 2017-12-05 DIAGNOSIS — Z1389 Encounter for screening for other disorder: Secondary | ICD-10-CM | POA: Diagnosis not present

## 2017-12-05 DIAGNOSIS — Z0000 Encounter for general adult medical examination without abnormal findings: Secondary | ICD-10-CM | POA: Diagnosis not present

## 2017-12-05 DIAGNOSIS — G4733 Obstructive sleep apnea (adult) (pediatric): Secondary | ICD-10-CM | POA: Diagnosis not present

## 2017-12-05 DIAGNOSIS — I10 Essential (primary) hypertension: Secondary | ICD-10-CM | POA: Diagnosis not present

## 2017-12-05 DIAGNOSIS — E7849 Other hyperlipidemia: Secondary | ICD-10-CM | POA: Diagnosis not present

## 2017-12-05 DIAGNOSIS — N401 Enlarged prostate with lower urinary tract symptoms: Secondary | ICD-10-CM | POA: Diagnosis not present

## 2017-12-05 DIAGNOSIS — R740 Nonspecific elevation of levels of transaminase and lactic acid dehydrogenase [LDH]: Secondary | ICD-10-CM | POA: Diagnosis not present

## 2017-12-05 DIAGNOSIS — F900 Attention-deficit hyperactivity disorder, predominantly inattentive type: Secondary | ICD-10-CM | POA: Diagnosis not present

## 2017-12-27 DIAGNOSIS — Z23 Encounter for immunization: Secondary | ICD-10-CM | POA: Diagnosis not present

## 2018-02-08 DIAGNOSIS — L578 Other skin changes due to chronic exposure to nonionizing radiation: Secondary | ICD-10-CM | POA: Diagnosis not present

## 2018-02-08 DIAGNOSIS — L570 Actinic keratosis: Secondary | ICD-10-CM | POA: Diagnosis not present

## 2018-02-08 DIAGNOSIS — L719 Rosacea, unspecified: Secondary | ICD-10-CM | POA: Diagnosis not present

## 2018-03-07 LAB — FECAL DNA COLORECTAL CANCER SCREENING (COLOGUARD): FIT-DNA (Cologuard): NEGATIVE

## 2018-04-22 DIAGNOSIS — L570 Actinic keratosis: Secondary | ICD-10-CM | POA: Diagnosis not present

## 2018-04-22 DIAGNOSIS — L578 Other skin changes due to chronic exposure to nonionizing radiation: Secondary | ICD-10-CM | POA: Diagnosis not present

## 2018-04-22 DIAGNOSIS — L821 Other seborrheic keratosis: Secondary | ICD-10-CM | POA: Diagnosis not present

## 2018-04-22 DIAGNOSIS — C44719 Basal cell carcinoma of skin of left lower limb, including hip: Secondary | ICD-10-CM | POA: Diagnosis not present

## 2018-12-04 ENCOUNTER — Other Ambulatory Visit: Payer: Self-pay

## 2018-12-04 DIAGNOSIS — R7301 Impaired fasting glucose: Secondary | ICD-10-CM | POA: Diagnosis not present

## 2018-12-04 DIAGNOSIS — Z20822 Contact with and (suspected) exposure to covid-19: Secondary | ICD-10-CM

## 2018-12-04 DIAGNOSIS — E7849 Other hyperlipidemia: Secondary | ICD-10-CM | POA: Diagnosis not present

## 2018-12-04 DIAGNOSIS — Z125 Encounter for screening for malignant neoplasm of prostate: Secondary | ICD-10-CM | POA: Diagnosis not present

## 2018-12-05 LAB — NOVEL CORONAVIRUS, NAA: SARS-CoV-2, NAA: NOT DETECTED

## 2018-12-10 DIAGNOSIS — R82998 Other abnormal findings in urine: Secondary | ICD-10-CM | POA: Diagnosis not present

## 2018-12-10 DIAGNOSIS — I10 Essential (primary) hypertension: Secondary | ICD-10-CM | POA: Diagnosis not present

## 2018-12-11 DIAGNOSIS — E785 Hyperlipidemia, unspecified: Secondary | ICD-10-CM | POA: Diagnosis not present

## 2018-12-11 DIAGNOSIS — R7301 Impaired fasting glucose: Secondary | ICD-10-CM | POA: Diagnosis not present

## 2018-12-11 DIAGNOSIS — N401 Enlarged prostate with lower urinary tract symptoms: Secondary | ICD-10-CM | POA: Diagnosis not present

## 2018-12-11 DIAGNOSIS — M19041 Primary osteoarthritis, right hand: Secondary | ICD-10-CM | POA: Diagnosis not present

## 2018-12-11 DIAGNOSIS — F900 Attention-deficit hyperactivity disorder, predominantly inattentive type: Secondary | ICD-10-CM | POA: Diagnosis not present

## 2018-12-11 DIAGNOSIS — G4700 Insomnia, unspecified: Secondary | ICD-10-CM | POA: Diagnosis not present

## 2018-12-11 DIAGNOSIS — K069 Disorder of gingiva and edentulous alveolar ridge, unspecified: Secondary | ICD-10-CM | POA: Diagnosis not present

## 2018-12-11 DIAGNOSIS — M542 Cervicalgia: Secondary | ICD-10-CM | POA: Diagnosis not present

## 2018-12-11 DIAGNOSIS — I10 Essential (primary) hypertension: Secondary | ICD-10-CM | POA: Diagnosis not present

## 2018-12-11 DIAGNOSIS — G4733 Obstructive sleep apnea (adult) (pediatric): Secondary | ICD-10-CM | POA: Diagnosis not present

## 2018-12-11 DIAGNOSIS — R740 Nonspecific elevation of levels of transaminase and lactic acid dehydrogenase [LDH]: Secondary | ICD-10-CM | POA: Diagnosis not present

## 2018-12-11 DIAGNOSIS — Z1331 Encounter for screening for depression: Secondary | ICD-10-CM | POA: Diagnosis not present

## 2018-12-11 DIAGNOSIS — Z0000 Encounter for general adult medical examination without abnormal findings: Secondary | ICD-10-CM | POA: Diagnosis not present

## 2018-12-12 DIAGNOSIS — Z1212 Encounter for screening for malignant neoplasm of rectum: Secondary | ICD-10-CM | POA: Diagnosis not present

## 2019-01-13 DIAGNOSIS — Z23 Encounter for immunization: Secondary | ICD-10-CM | POA: Diagnosis not present

## 2019-01-15 DIAGNOSIS — M542 Cervicalgia: Secondary | ICD-10-CM | POA: Diagnosis not present

## 2019-01-17 DIAGNOSIS — M542 Cervicalgia: Secondary | ICD-10-CM | POA: Diagnosis not present

## 2019-01-20 DIAGNOSIS — M542 Cervicalgia: Secondary | ICD-10-CM | POA: Diagnosis not present

## 2019-01-24 DIAGNOSIS — M542 Cervicalgia: Secondary | ICD-10-CM | POA: Diagnosis not present

## 2019-01-27 DIAGNOSIS — M542 Cervicalgia: Secondary | ICD-10-CM | POA: Diagnosis not present

## 2019-01-31 DIAGNOSIS — M542 Cervicalgia: Secondary | ICD-10-CM | POA: Diagnosis not present

## 2019-02-05 DIAGNOSIS — M542 Cervicalgia: Secondary | ICD-10-CM | POA: Diagnosis not present

## 2019-02-06 ENCOUNTER — Inpatient Hospital Stay: Admit: 2019-02-06 | Payer: MEDICARE | Primary: Emergency Medicine

## 2019-02-06 DIAGNOSIS — Z125 Encounter for screening for malignant neoplasm of prostate: Secondary | ICD-10-CM

## 2019-02-06 LAB — ECHOCARDIOGRAM COMPLETE 2D W DOPPLER W COLOR: Left Ventricular Ejection Fraction: 55

## 2019-02-07 DIAGNOSIS — M542 Cervicalgia: Secondary | ICD-10-CM | POA: Diagnosis not present

## 2019-02-10 DIAGNOSIS — M542 Cervicalgia: Secondary | ICD-10-CM | POA: Diagnosis not present

## 2019-02-17 DIAGNOSIS — M542 Cervicalgia: Secondary | ICD-10-CM | POA: Diagnosis not present

## 2019-02-21 DIAGNOSIS — M542 Cervicalgia: Secondary | ICD-10-CM | POA: Diagnosis not present

## 2019-03-26 DIAGNOSIS — M542 Cervicalgia: Secondary | ICD-10-CM | POA: Diagnosis not present

## 2019-04-11 DIAGNOSIS — H2513 Age-related nuclear cataract, bilateral: Secondary | ICD-10-CM | POA: Diagnosis not present

## 2019-05-14 DIAGNOSIS — L570 Actinic keratosis: Secondary | ICD-10-CM | POA: Diagnosis not present

## 2019-05-14 DIAGNOSIS — L578 Other skin changes due to chronic exposure to nonionizing radiation: Secondary | ICD-10-CM | POA: Diagnosis not present

## 2019-05-14 DIAGNOSIS — L821 Other seborrheic keratosis: Secondary | ICD-10-CM | POA: Diagnosis not present

## 2019-05-15 DIAGNOSIS — S199XXA Unspecified injury of neck, initial encounter: Secondary | ICD-10-CM | POA: Diagnosis not present

## 2019-05-15 DIAGNOSIS — M47812 Spondylosis without myelopathy or radiculopathy, cervical region: Secondary | ICD-10-CM | POA: Diagnosis not present

## 2019-05-15 DIAGNOSIS — S134XXA Sprain of ligaments of cervical spine, initial encounter: Secondary | ICD-10-CM | POA: Diagnosis not present

## 2019-05-15 DIAGNOSIS — S0990XA Unspecified injury of head, initial encounter: Secondary | ICD-10-CM | POA: Diagnosis not present

## 2019-05-22 LAB — TSH: TSH: 2.66 mIU/L (ref 0.30–5.00)

## 2019-05-22 LAB — CBC WITH AUTO DIFFERENTIAL
Basophils %: 1 % (ref 0–2)
Basophils Absolute: 0.13 10*3/uL (ref 0.00–0.20)
Eosinophils %: 3 % (ref 1–4)
Eosinophils Absolute: 0.28 10*3/uL (ref 0.00–0.44)
Hematocrit: 45.6 % (ref 40.7–50.3)
Hemoglobin: 14.5 g/dL (ref 13.0–17.0)
Immature Granulocytes %: 0 %
Immature Granulocytes Absolute: <0.03 10*3/uL (ref 0.00–0.30)
Lymphocytes Absolute: 3.63 10*3/uL (ref 1.10–3.70)
Lymphocytes: 40 % (ref 24–43)
MCH: 29.9 pg (ref 25.2–33.5)
MCHC: 31.8 g/dL (ref 28.4–34.8)
MCV: 94 fL (ref 82.6–102.9)
MPV: 10.5 fL (ref 8.1–13.5)
Monocytes %: 9 % (ref 3–12)
Monocytes Absolute: 0.79 10*3/uL (ref 0.10–1.20)
NRBC Automated: 0 per 100 WBC
Neutrophils Absolute: 4.17 10*3/uL (ref 1.50–8.10)
Platelets: 390 10*3/uL (ref 138–453)
RBC: 4.85 m/uL (ref 4.21–5.77)
RDW: 12.7 % (ref 11.8–14.4)
Seg Neutrophils: 47 % (ref 36–65)
WBC: 9 10*3/uL (ref 3.5–11.3)

## 2019-05-22 LAB — LIPID, FASTING
Chol/HDL Ratio: 4.7 (ref <5)
Cholesterol, Fasting: 186 mg/dL (ref <200)
HDL: 40 mg/dL — ABNORMAL LOW (ref >40)
LDL Cholesterol: 104 mg/dL (ref 0–130)
Triglyceride, Fasting: 211 mg/dL — ABNORMAL HIGH (ref <150)

## 2019-05-22 LAB — HEPATIC FUNCTION PANEL
ALT: 22 U/L (ref 5–41)
AST: 19 U/L (ref <40)
Albumin/Globulin Ratio: 2 (ref 1.0–2.5)
Albumin: 4.7 g/dL (ref 3.5–5.2)
Alkaline Phosphatase: 129 U/L (ref 40–129)
Bilirubin, Direct: 0.1 mg/dL (ref <0.31)
Bilirubin, Indirect: 0.35 mg/dL (ref 0.00–1.00)
Total Bilirubin: 0.45 mg/dL (ref 0.3–1.2)
Total Protein: 7.1 g/dL (ref 6.4–8.3)

## 2019-05-22 LAB — BASIC METABOLIC PANEL
Anion Gap: 11 mmol/L (ref 9–17)
BUN: 15 mg/dL (ref 8–23)
CO2: 28 mmol/L (ref 20–31)
Calcium: 9.8 mg/dL (ref 8.6–10.4)
Chloride: 101 mmol/L (ref 98–107)
Creatinine: 0.98 mg/dL (ref 0.70–1.20)
GFR African American: >60 mL/min (ref >60)
GFR Non-African American: >60 mL/min (ref >60)
Glucose: 94 mg/dL (ref 70–99)
Potassium: 4.5 mmol/L (ref 3.7–5.3)
Sodium: 140 mmol/L (ref 135–144)

## 2019-05-22 LAB — T4, FREE: Thyroxine, Free: 1.23 ng/dL (ref 0.93–1.70)

## 2019-05-22 LAB — PSA SCREENING: PSA: 1.63 ug/L (ref <4.1)

## 2019-05-22 LAB — VITAMIN D 25 HYDROXY: Vit D, 25-Hydroxy: 44.8 ng/mL (ref 30.0–100.0)

## 2019-11-27 DIAGNOSIS — Z23 Encounter for immunization: Secondary | ICD-10-CM | POA: Diagnosis not present

## 2019-12-25 DIAGNOSIS — R7301 Impaired fasting glucose: Secondary | ICD-10-CM | POA: Diagnosis not present

## 2019-12-25 DIAGNOSIS — E785 Hyperlipidemia, unspecified: Secondary | ICD-10-CM | POA: Diagnosis not present

## 2019-12-25 DIAGNOSIS — Z125 Encounter for screening for malignant neoplasm of prostate: Secondary | ICD-10-CM | POA: Diagnosis not present

## 2020-01-01 DIAGNOSIS — R7401 Elevation of levels of liver transaminase levels: Secondary | ICD-10-CM | POA: Diagnosis not present

## 2020-01-01 DIAGNOSIS — M65352 Trigger finger, left little finger: Secondary | ICD-10-CM | POA: Diagnosis not present

## 2020-01-01 DIAGNOSIS — Z1331 Encounter for screening for depression: Secondary | ICD-10-CM | POA: Diagnosis not present

## 2020-01-01 DIAGNOSIS — Z0000 Encounter for general adult medical examination without abnormal findings: Secondary | ICD-10-CM | POA: Diagnosis not present

## 2020-01-01 DIAGNOSIS — E785 Hyperlipidemia, unspecified: Secondary | ICD-10-CM | POA: Diagnosis not present

## 2020-01-01 DIAGNOSIS — Z1339 Encounter for screening examination for other mental health and behavioral disorders: Secondary | ICD-10-CM | POA: Diagnosis not present

## 2020-01-01 DIAGNOSIS — N401 Enlarged prostate with lower urinary tract symptoms: Secondary | ICD-10-CM | POA: Diagnosis not present

## 2020-01-01 DIAGNOSIS — G4700 Insomnia, unspecified: Secondary | ICD-10-CM | POA: Diagnosis not present

## 2020-01-01 DIAGNOSIS — I10 Essential (primary) hypertension: Secondary | ICD-10-CM | POA: Diagnosis not present

## 2020-01-01 DIAGNOSIS — M19041 Primary osteoarthritis, right hand: Secondary | ICD-10-CM | POA: Diagnosis not present

## 2020-01-01 DIAGNOSIS — G629 Polyneuropathy, unspecified: Secondary | ICD-10-CM | POA: Diagnosis not present

## 2020-01-01 DIAGNOSIS — F900 Attention-deficit hyperactivity disorder, predominantly inattentive type: Secondary | ICD-10-CM | POA: Diagnosis not present

## 2020-01-01 DIAGNOSIS — R82998 Other abnormal findings in urine: Secondary | ICD-10-CM | POA: Diagnosis not present

## 2020-02-10 DIAGNOSIS — M79642 Pain in left hand: Secondary | ICD-10-CM | POA: Diagnosis not present

## 2020-02-10 DIAGNOSIS — M79641 Pain in right hand: Secondary | ICD-10-CM | POA: Diagnosis not present

## 2020-02-10 DIAGNOSIS — Z1212 Encounter for screening for malignant neoplasm of rectum: Secondary | ICD-10-CM | POA: Diagnosis not present

## 2020-02-10 DIAGNOSIS — M65352 Trigger finger, left little finger: Secondary | ICD-10-CM | POA: Diagnosis not present

## 2020-02-24 DIAGNOSIS — M79642 Pain in left hand: Secondary | ICD-10-CM | POA: Diagnosis not present

## 2020-03-04 DIAGNOSIS — M180 Bilateral primary osteoarthritis of first carpometacarpal joints: Secondary | ICD-10-CM | POA: Diagnosis not present

## 2020-03-04 DIAGNOSIS — M65352 Trigger finger, left little finger: Secondary | ICD-10-CM | POA: Diagnosis not present

## 2020-04-12 DIAGNOSIS — M79642 Pain in left hand: Secondary | ICD-10-CM | POA: Diagnosis not present

## 2020-04-12 DIAGNOSIS — M65352 Trigger finger, left little finger: Secondary | ICD-10-CM | POA: Diagnosis not present

## 2020-04-12 DIAGNOSIS — M79641 Pain in right hand: Secondary | ICD-10-CM | POA: Diagnosis not present

## 2020-05-17 DIAGNOSIS — G4700 Insomnia, unspecified: Secondary | ICD-10-CM | POA: Diagnosis not present

## 2020-05-17 DIAGNOSIS — J309 Allergic rhinitis, unspecified: Secondary | ICD-10-CM | POA: Diagnosis not present

## 2020-05-17 DIAGNOSIS — E785 Hyperlipidemia, unspecified: Secondary | ICD-10-CM | POA: Diagnosis not present

## 2020-05-17 DIAGNOSIS — N401 Enlarged prostate with lower urinary tract symptoms: Secondary | ICD-10-CM | POA: Diagnosis not present

## 2020-05-17 DIAGNOSIS — I10 Essential (primary) hypertension: Secondary | ICD-10-CM | POA: Diagnosis not present

## 2020-05-17 DIAGNOSIS — M19041 Primary osteoarthritis, right hand: Secondary | ICD-10-CM | POA: Diagnosis not present

## 2020-05-17 DIAGNOSIS — R7301 Impaired fasting glucose: Secondary | ICD-10-CM | POA: Diagnosis not present

## 2020-06-02 LAB — CBC WITH AUTO DIFFERENTIAL
Basophils %: 1 % (ref 0–2)
Basophils Absolute: 0.11 10*3/uL (ref 0.00–0.20)
Eosinophils %: 4 % (ref 1–4)
Eosinophils Absolute: 0.4 10*3/uL (ref 0.00–0.44)
Hematocrit: 43.9 % (ref 40.7–50.3)
Hemoglobin: 14.4 g/dL (ref 13.0–17.0)
Immature Granulocytes %: 0 %
Immature Granulocytes Absolute: <0.03 10*3/uL (ref 0.00–0.30)
Lymphocytes Absolute: 2.78 10*3/uL (ref 1.10–3.70)
Lymphocytes: 31 % (ref 24–43)
MCH: 30.1 pg (ref 25.2–33.5)
MCHC: 32.8 g/dL (ref 28.4–34.8)
MCV: 91.8 fL (ref 82.6–102.9)
MPV: 10.3 fL (ref 8.1–13.5)
Monocytes %: 9 % (ref 3–12)
Monocytes Absolute: 0.8 10*3/uL (ref 0.10–1.20)
NRBC Automated: 0 per 100 WBC
Neutrophils Absolute: 4.9 10*3/uL (ref 1.50–8.10)
Platelets: 325 10*3/uL (ref 138–453)
RBC: 4.78 m/uL (ref 4.21–5.77)
RDW: 12.3 % (ref 11.8–14.4)
Seg Neutrophils: 55 % (ref 36–65)
WBC: 9 10*3/uL (ref 3.5–11.3)

## 2020-06-02 LAB — BASIC METABOLIC PANEL W/ REFLEX TO MG FOR LOW K
Anion Gap: 13 mmol/L (ref 9–17)
BUN: 16 mg/dL (ref 8–23)
CO2: 26 mmol/L (ref 20–31)
Calcium: 9.6 mg/dL (ref 8.6–10.4)
Chloride: 103 mmol/L (ref 98–107)
Creatinine: 1 mg/dL (ref 0.70–1.20)
GFR African American: >60 mL/min (ref >60)
GFR Non-African American: >60 mL/min (ref >60)
Glucose: 91 mg/dL (ref 70–99)
Potassium: 4.8 mmol/L (ref 3.7–5.3)
Sodium: 142 mmol/L (ref 135–144)

## 2020-06-02 LAB — LIPID PANEL
Chol/HDL Ratio: 4.2 (ref <5)
Cholesterol: 167 mg/dL (ref <200)
HDL: 40 mg/dL — ABNORMAL LOW (ref >40)
LDL Cholesterol: 89 mg/dL (ref 0–130)
Triglycerides: 192 mg/dL — ABNORMAL HIGH (ref <150)

## 2020-06-02 LAB — T4, FREE: Thyroxine, Free: 1.23 ng/dL (ref 0.93–1.70)

## 2020-06-02 LAB — HEPATIC FUNCTION PANEL
ALT: 16 U/L (ref 5–41)
AST: 15 U/L (ref <40)
Albumin/Globulin Ratio: 2 (ref 1.0–2.5)
Albumin: 4.5 g/dL (ref 3.5–5.2)
Alkaline Phosphatase: 118 U/L (ref 40–129)
Bilirubin, Direct: 0.11 mg/dL (ref <0.31)
Bilirubin, Indirect: 0.32 mg/dL (ref 0.00–1.00)
Total Bilirubin: 0.43 mg/dL (ref 0.3–1.2)
Total Protein: 6.7 g/dL (ref 6.4–8.3)

## 2020-06-02 LAB — VITAMIN D 25 HYDROXY: Vit D, 25-Hydroxy: 38.7 ng/mL (ref >29.9)

## 2020-06-02 LAB — PSA, DIAGNOSTIC: PSA: 1.3 ug/L (ref <4.1)

## 2020-06-02 LAB — TSH: TSH: 2 mIU/L (ref 0.30–5.00)

## 2020-06-07 DIAGNOSIS — M65352 Trigger finger, left little finger: Secondary | ICD-10-CM | POA: Diagnosis not present

## 2020-06-07 DIAGNOSIS — M180 Bilateral primary osteoarthritis of first carpometacarpal joints: Secondary | ICD-10-CM | POA: Diagnosis not present

## 2020-06-08 ENCOUNTER — Other Ambulatory Visit: Payer: Self-pay | Admitting: Orthopedic Surgery

## 2020-06-08 DIAGNOSIS — M65352 Trigger finger, left little finger: Secondary | ICD-10-CM

## 2020-07-12 ENCOUNTER — Ambulatory Visit
Admission: RE | Admit: 2020-07-12 | Discharge: 2020-07-12 | Disposition: A | Discharge status: Home / Self Care | Payer: Medicare Other | Source: Ambulatory Visit | Attending: Orthopedic Surgery | Admitting: Orthopedic Surgery

## 2020-07-12 DIAGNOSIS — M67442 Ganglion, left hand: Secondary | ICD-10-CM | POA: Diagnosis not present

## 2020-07-12 DIAGNOSIS — M6530 Trigger finger, unspecified finger: Secondary | ICD-10-CM | POA: Diagnosis not present

## 2020-07-12 DIAGNOSIS — M65352 Trigger finger, left little finger: Secondary | ICD-10-CM

## 2020-07-15 DIAGNOSIS — M79641 Pain in right hand: Secondary | ICD-10-CM | POA: Diagnosis not present

## 2020-07-15 DIAGNOSIS — M79642 Pain in left hand: Secondary | ICD-10-CM | POA: Diagnosis not present

## 2020-07-15 DIAGNOSIS — M180 Bilateral primary osteoarthritis of first carpometacarpal joints: Secondary | ICD-10-CM | POA: Diagnosis not present

## 2020-07-15 DIAGNOSIS — M65352 Trigger finger, left little finger: Secondary | ICD-10-CM | POA: Diagnosis not present

## 2020-08-11 DIAGNOSIS — Z23 Encounter for immunization: Secondary | ICD-10-CM | POA: Diagnosis not present

## 2020-08-17 DIAGNOSIS — M19041 Primary osteoarthritis, right hand: Secondary | ICD-10-CM | POA: Diagnosis not present

## 2020-08-17 DIAGNOSIS — E785 Hyperlipidemia, unspecified: Secondary | ICD-10-CM | POA: Diagnosis not present

## 2020-08-17 DIAGNOSIS — I10 Essential (primary) hypertension: Secondary | ICD-10-CM | POA: Diagnosis not present

## 2020-09-13 DIAGNOSIS — M65352 Trigger finger, left little finger: Secondary | ICD-10-CM | POA: Diagnosis not present

## 2020-09-13 DIAGNOSIS — M67442 Ganglion, left hand: Secondary | ICD-10-CM | POA: Diagnosis not present

## 2020-11-15 DIAGNOSIS — M25642 Stiffness of left hand, not elsewhere classified: Secondary | ICD-10-CM | POA: Diagnosis not present

## 2020-11-15 DIAGNOSIS — M65352 Trigger finger, left little finger: Secondary | ICD-10-CM | POA: Diagnosis not present

## 2020-11-15 DIAGNOSIS — R29898 Other symptoms and signs involving the musculoskeletal system: Secondary | ICD-10-CM | POA: Diagnosis not present

## 2020-11-17 DIAGNOSIS — M19041 Primary osteoarthritis, right hand: Secondary | ICD-10-CM | POA: Diagnosis not present

## 2020-11-17 DIAGNOSIS — I10 Essential (primary) hypertension: Secondary | ICD-10-CM | POA: Diagnosis not present

## 2020-11-17 DIAGNOSIS — E785 Hyperlipidemia, unspecified: Secondary | ICD-10-CM | POA: Diagnosis not present

## 2020-11-23 DIAGNOSIS — M25642 Stiffness of left hand, not elsewhere classified: Secondary | ICD-10-CM | POA: Diagnosis not present

## 2020-11-23 DIAGNOSIS — R29898 Other symptoms and signs involving the musculoskeletal system: Secondary | ICD-10-CM | POA: Diagnosis not present

## 2020-11-23 DIAGNOSIS — M65352 Trigger finger, left little finger: Secondary | ICD-10-CM | POA: Diagnosis not present

## 2020-11-29 DIAGNOSIS — M25642 Stiffness of left hand, not elsewhere classified: Secondary | ICD-10-CM | POA: Diagnosis not present

## 2020-11-29 DIAGNOSIS — R29898 Other symptoms and signs involving the musculoskeletal system: Secondary | ICD-10-CM | POA: Diagnosis not present

## 2020-11-29 DIAGNOSIS — M65352 Trigger finger, left little finger: Secondary | ICD-10-CM | POA: Diagnosis not present

## 2021-01-17 DIAGNOSIS — I10 Essential (primary) hypertension: Secondary | ICD-10-CM | POA: Diagnosis not present

## 2021-01-17 DIAGNOSIS — E785 Hyperlipidemia, unspecified: Secondary | ICD-10-CM | POA: Diagnosis not present

## 2021-01-17 DIAGNOSIS — R7301 Impaired fasting glucose: Secondary | ICD-10-CM | POA: Diagnosis not present

## 2021-01-17 DIAGNOSIS — Z125 Encounter for screening for malignant neoplasm of prostate: Secondary | ICD-10-CM | POA: Diagnosis not present

## 2021-01-24 DIAGNOSIS — E785 Hyperlipidemia, unspecified: Secondary | ICD-10-CM | POA: Diagnosis not present

## 2021-01-24 DIAGNOSIS — G4733 Obstructive sleep apnea (adult) (pediatric): Secondary | ICD-10-CM | POA: Diagnosis not present

## 2021-01-24 DIAGNOSIS — I10 Essential (primary) hypertension: Secondary | ICD-10-CM | POA: Diagnosis not present

## 2021-01-24 DIAGNOSIS — R7301 Impaired fasting glucose: Secondary | ICD-10-CM | POA: Diagnosis not present

## 2021-01-24 DIAGNOSIS — G4700 Insomnia, unspecified: Secondary | ICD-10-CM | POA: Diagnosis not present

## 2021-01-24 DIAGNOSIS — Z0000 Encounter for general adult medical examination without abnormal findings: Secondary | ICD-10-CM | POA: Diagnosis not present

## 2021-01-24 DIAGNOSIS — N3941 Urge incontinence: Secondary | ICD-10-CM | POA: Diagnosis not present

## 2021-01-24 DIAGNOSIS — M19041 Primary osteoarthritis, right hand: Secondary | ICD-10-CM | POA: Diagnosis not present

## 2021-01-24 DIAGNOSIS — R7401 Elevation of levels of liver transaminase levels: Secondary | ICD-10-CM | POA: Diagnosis not present

## 2021-01-24 DIAGNOSIS — Z1331 Encounter for screening for depression: Secondary | ICD-10-CM | POA: Diagnosis not present

## 2021-01-24 DIAGNOSIS — Z23 Encounter for immunization: Secondary | ICD-10-CM | POA: Diagnosis not present

## 2021-01-24 DIAGNOSIS — F900 Attention-deficit hyperactivity disorder, predominantly inattentive type: Secondary | ICD-10-CM | POA: Diagnosis not present

## 2021-01-24 DIAGNOSIS — G629 Polyneuropathy, unspecified: Secondary | ICD-10-CM | POA: Diagnosis not present

## 2021-01-24 DIAGNOSIS — R82998 Other abnormal findings in urine: Secondary | ICD-10-CM | POA: Diagnosis not present

## 2021-01-24 DIAGNOSIS — Z1339 Encounter for screening examination for other mental health and behavioral disorders: Secondary | ICD-10-CM | POA: Diagnosis not present

## 2021-01-24 DIAGNOSIS — N401 Enlarged prostate with lower urinary tract symptoms: Secondary | ICD-10-CM | POA: Diagnosis not present

## 2021-02-07 DIAGNOSIS — J069 Acute upper respiratory infection, unspecified: Secondary | ICD-10-CM | POA: Diagnosis not present

## 2021-02-07 DIAGNOSIS — R5383 Other fatigue: Secondary | ICD-10-CM | POA: Diagnosis not present

## 2021-02-07 DIAGNOSIS — R051 Acute cough: Secondary | ICD-10-CM | POA: Diagnosis not present

## 2021-02-07 DIAGNOSIS — J029 Acute pharyngitis, unspecified: Secondary | ICD-10-CM | POA: Diagnosis not present

## 2021-02-07 DIAGNOSIS — I10 Essential (primary) hypertension: Secondary | ICD-10-CM | POA: Diagnosis not present

## 2021-02-07 DIAGNOSIS — J40 Bronchitis, not specified as acute or chronic: Secondary | ICD-10-CM | POA: Diagnosis not present

## 2021-02-07 DIAGNOSIS — R0981 Nasal congestion: Secondary | ICD-10-CM | POA: Diagnosis not present

## 2021-02-07 DIAGNOSIS — Z1152 Encounter for screening for COVID-19: Secondary | ICD-10-CM | POA: Diagnosis not present

## 2021-02-16 DIAGNOSIS — M19041 Primary osteoarthritis, right hand: Secondary | ICD-10-CM | POA: Diagnosis not present

## 2021-02-16 DIAGNOSIS — E785 Hyperlipidemia, unspecified: Secondary | ICD-10-CM | POA: Diagnosis not present

## 2021-02-16 DIAGNOSIS — I10 Essential (primary) hypertension: Secondary | ICD-10-CM | POA: Diagnosis not present

## 2021-03-02 DIAGNOSIS — L821 Other seborrheic keratosis: Secondary | ICD-10-CM | POA: Diagnosis not present

## 2021-03-02 DIAGNOSIS — L570 Actinic keratosis: Secondary | ICD-10-CM | POA: Diagnosis not present

## 2021-03-02 DIAGNOSIS — L578 Other skin changes due to chronic exposure to nonionizing radiation: Secondary | ICD-10-CM | POA: Diagnosis not present

## 2021-03-02 DIAGNOSIS — C44311 Basal cell carcinoma of skin of nose: Secondary | ICD-10-CM | POA: Diagnosis not present

## 2021-03-25 ENCOUNTER — Encounter: Payer: Self-pay | Admitting: Gastroenterology

## 2021-03-31 ENCOUNTER — Encounter

## 2021-04-04 ENCOUNTER — Encounter

## 2021-04-07 ENCOUNTER — Inpatient Hospital Stay: Admit: 2021-04-07 | Payer: MEDICARE | Primary: Emergency Medicine

## 2021-04-07 DIAGNOSIS — I2510 Atherosclerotic heart disease of native coronary artery without angina pectoris: Secondary | ICD-10-CM

## 2021-04-07 DIAGNOSIS — R079 Chest pain, unspecified: Secondary | ICD-10-CM

## 2021-04-07 DIAGNOSIS — R9431 Abnormal electrocardiogram [ECG] [EKG]: Secondary | ICD-10-CM

## 2021-04-07 LAB — ECHOCARDIOGRAM COMPLETE 2D W DOPPLER W COLOR: Left Ventricular Ejection Fraction: 55

## 2021-04-07 LAB — NM MYOCARDIAL SPECT REST EXERCISE OR RX: Left Ventricular Ejection Fraction: 70

## 2021-04-07 MED ORDER — ATROPINE SULFATE 1 MG/10ML IJ SOSY
1 MG/0ML | INTRAMUSCULAR | Status: DC | PRN
Start: 2021-04-07 — End: 2021-04-07

## 2021-04-07 MED ORDER — SODIUM CHLORIDE 0.9 % IV SOLN
0.9 % | INTRAVENOUS | Status: DC | PRN
Start: 2021-04-07 — End: 2021-04-07

## 2021-04-07 MED ORDER — TECHNETIUM TC 99M SESTAMIBI IV KIT
Freq: Once | INTRAVENOUS | Status: AC | PRN
Start: 2021-04-07 — End: 2021-04-07
  Administered 2021-04-07: 13:00:00 10.9 via INTRAVENOUS

## 2021-04-07 MED ORDER — NORMAL SALINE FLUSH 0.9 % IV SOLN
0.9 % | INTRAVENOUS | Status: DC | PRN
Start: 2021-04-07 — End: 2021-04-07

## 2021-04-07 MED ORDER — NORMAL SALINE FLUSH 0.9 % IV SOLN
0.9 % | INTRAVENOUS | Status: DC | PRN
Start: 2021-04-07 — End: 2021-04-10
  Administered 2021-04-07: 13:00:00 10 mL via INTRAVENOUS

## 2021-04-07 MED ORDER — TECHNETIUM TC 99M SESTAMIBI IV KIT
Freq: Once | INTRAVENOUS | Status: AC | PRN
Start: 2021-04-07 — End: 2021-04-07
  Administered 2021-04-07: 15:00:00 32.2 via INTRAVENOUS

## 2021-04-07 MED ORDER — NITROGLYCERIN 0.4 MG SL SUBL
0.4 MG | SUBLINGUAL | Status: DC | PRN
Start: 2021-04-07 — End: 2021-04-07

## 2021-04-07 MED ORDER — ALBUTEROL SULFATE HFA 108 (90 BASE) MCG/ACT IN AERS
108 (90 Base) MCG/ACT | RESPIRATORY_TRACT | Status: DC | PRN
Start: 2021-04-07 — End: 2021-04-07

## 2021-04-07 MED ORDER — METOPROLOL TARTRATE 5 MG/5ML IV SOLN
5 MG/ML | INTRAVENOUS | Status: DC | PRN
Start: 2021-04-07 — End: 2021-04-07

## 2021-04-07 NOTE — Procedures (Signed)
[facility] - [facility]                    [address]  [place], [place] [address]                              CARDIAC STRESS TEST    PATIENT NAME: Ross Parker, Ross Parker                 DOB:        02/15/1951  MED REC NO:   [id number]                              ROOM:  ACCOUNT NO:   [id number]                           ADMIT DATE: 04/07/2021  PROVIDER:     Monna Fam    DATE OF STUDY:  04/07/2021    TEST TYPE: CARDIOLYTE TREADMILL STRESS TEST  INDICATION: CHEST PAIN, ABNORMAL EKG  REFERRING PHYSICIAN: NEIMAN ODEH    100% MAX PREDICTED HEART RATE: 149 BEATS PER MINUTE  85% MAX PREDICTED HEART RATE: 126 BEATS PER MINUTE  RESTING HEART RATE: 70 BEATS PER MINUTE  MAXIMUM HEART RATE ACHIEVED: 137 BEATS PER MINUTE  PERCENTAGE OF AGE PREDICTED MAXIMUM ACHIEVED: 91  RESTING BLOOD PRESSURE: 125/68  PEAK BLOOD PRESSURE: 159/84  PEAK DOUBLE PRODUCT: 21783  METS: 10.90    MEDICATION(S) GIVEN:  REASON FOR TERMINATION: DYSPNEA MILD  TOTAL TIME ON TREADMILL: 9:30 MINUTES    RESTING EKG: ABNORMAL. SINUS RHYTHM 70. RIGHT BUNDLE BRANCH BLOCK  STRESS HEART RESPONSE: NORMAL RESPONSE  BLOOD PRESSURE RESPONSE: APPROPRIATE  STRESS EKGs: NO CHANGES SEEN  CHEST DISCOMFORT: NO PAIN DURING STRESS  ISCHEMIC EKG CHANGES: NONE    EKG IMPRESSION: ELECTROCARDIOGRAPHICALLY NEGATIVE TREADMILL STRESS TEST.  RADIOISOTOPE RESULTS TO FOLLOW FROM DEPARTMENT OF NUCLEAR MEDICINE.    COMMENTS: GOOD AEROBIC CAPACITY        Sharyon Peitz    D: 04/07/2021 15:21:47       T: 04/07/2021 15:23:37     [email or web address]  Job#: [id number]     Doc#: Unknown    CC:    (Retain this field even if not dictated or not decipherable)

## 2021-04-07 NOTE — Progress Notes (Signed)
CST.  Stress Tech performs patient preparation of physical comfort, review test procedures, pre-stress EKG. Lung Sounds clear in all fields. Consent verified. Educated patient on test procedure and possible side effects. Cardiologist reviewed pre-test EKG and is present for test. Patient tolerated test well with minor SOB  which resolved to baseline after test with rest and H2O. EKG portion of test complete, Nuc Med portion pending.  Pretest VS: BP 125/68 HR 78  Post test VS: BP 146/80 HR 90

## 2021-04-26 ENCOUNTER — Encounter: Payer: Self-pay | Admitting: Gastroenterology

## 2021-05-11 ENCOUNTER — Ambulatory Visit (AMBULATORY_SURGERY_CENTER): Payer: Medicare Other | Admitting: *Deleted

## 2021-05-11 ENCOUNTER — Other Ambulatory Visit: Payer: Self-pay

## 2021-05-11 VITALS — Ht 68.0 in | Wt 170.0 lb

## 2021-05-11 DIAGNOSIS — Z1211 Encounter for screening for malignant neoplasm of colon: Secondary | ICD-10-CM

## 2021-05-11 MED ORDER — PEG 3350-KCL-NA BICARB-NACL 420 G PO SOLR: 4000.0000 mL | Freq: Once | ORAL | 0 refills | Status: AC

## 2021-05-11 NOTE — Progress Notes (Signed)

## 2021-05-19 ENCOUNTER — Encounter: Payer: Self-pay | Admitting: Gastroenterology

## 2021-05-25 ENCOUNTER — Encounter: Payer: Self-pay | Admitting: Gastroenterology

## 2021-05-25 ENCOUNTER — Ambulatory Visit (AMBULATORY_SURGERY_CENTER): Payer: Medicare Other | Admitting: Gastroenterology

## 2021-05-25 VITALS — BP 130/93 | HR 76 | Temp 98.7°F | Resp 11 | Ht 68.0 in | Wt 170.0 lb

## 2021-05-25 DIAGNOSIS — Z1211 Encounter for screening for malignant neoplasm of colon: Secondary | ICD-10-CM | POA: Diagnosis not present

## 2021-05-25 DIAGNOSIS — G4733 Obstructive sleep apnea (adult) (pediatric): Secondary | ICD-10-CM | POA: Diagnosis not present

## 2021-05-25 DIAGNOSIS — D123 Benign neoplasm of transverse colon: Secondary | ICD-10-CM | POA: Diagnosis not present

## 2021-05-25 DIAGNOSIS — K635 Polyp of colon: Secondary | ICD-10-CM | POA: Diagnosis not present

## 2021-05-25 DIAGNOSIS — I10 Essential (primary) hypertension: Secondary | ICD-10-CM | POA: Diagnosis not present

## 2021-05-25 MED ORDER — SODIUM CHLORIDE 0.9 % IV SOLN: 500.0000 mL | Freq: Once | INTRAVENOUS | Status: DC

## 2021-05-25 NOTE — Progress Notes (Signed)
Report given to PACU, vss 

## 2021-05-25 NOTE — Progress Notes (Signed)
? ?History & Physical ? ?Primary Care Physician:  Ginger Organ., MD ?Primary Gastroenterologist: Lucio Edward, MD ? ?CHIEF COMPLAINT:  CRC screening  ? ?HPI: Christopher Forbush. is a 71 y.o. male for CRC screening, average risk, with colonoscopy. ? ? ?Past Medical History:  ?Diagnosis Date  ? ALLERGIC RHINITIS   ? Cancer [facility])   ? skin  ? Hyperlipidemia   ? Hypertension   ? OSA (obstructive sleep apnea)   ? NPSG 02-19-02 - RDI 20.2/hr, wears CPAP  ? Sleep apnea   ? ? ?Past Surgical History:  ?Procedure Laterality Date  ? APPENDECTOMY    ? COLONOSCOPY    ? NASAL SEPTUM SURGERY    ? 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 ? ?Prior to Admission medications   ?Medication Sig Start Date End Date Taking? Authorizing Provider  ?amphetamine-dextroamphetamine (ADDERALL XR) 20 MG 24 hr capsule Take by mouth every morning.     Yes [provider]  ?atorvastatin (LIPITOR) 10 MG tablet Take 10 mg by mouth at bedtime. 07/15/14  Yes [provider]  ?eszopiclone (LUNESTA) 2 MG TABS tablet  07/01/20  Yes [provider]  ?fluticasone (FLONASE) 50 MCG/ACT nasal spray 2 sprays by Nasal route daily.     Yes [provider]  ?losartan-hydrochlorothiazide (HYZAAR) 100-12.5 MG per tablet Takes one half of tablet 11/23/10  Yes [provider]  ?meloxicam (MOBIC) 7.5 MG tablet Take 1 tablet by mouth daily. 12/10/12  Yes [provider]  ?tamsulosin (FLOMAX) 0.4 MG CAPS capsule Take 0.4 mg by mouth at bedtime. 07/15/14  Yes [provider]  ?amphetamine-dextroamphetamine (ADDERALL) 10 MG tablet take 1 tablet by mouth daily if needed 12/21/14   [provider]  ?desloratadine (CLARINEX) 5 MG tablet Take by mouth daily as needed.      [provider]  ?EPINEPHrine (EPI-PEN) 0.3 mg/0.3 mL SOAJ injection Inject 0.3 mLs (0.3 mg total) into the muscle once. ?Patient not taking: Reported on 05/11/2021 01/20/13   Deneise Lever, MD  ?fexofenadine (ALLEGRA) 180  MG tablet Take by mouth daily as needed.      [provider]  ?ibuprofen (ADVIL) 400 MG tablet Take by mouth. 05/15/19   [provider]  ?NONFORMULARY OR COMPOUNDED ITEM Allergy Vaccine ?1:10 ?Given at [facility] ?Patient not taking: Reported on 05/11/2021    [provider]  ?VOLTAREN 1 % GEL Apply as directed 10/22/12   [provider]  ? ? ?Current Outpatient Medications  ?Medication Sig Dispense Refill  ? amphetamine-dextroamphetamine (ADDERALL XR) 20 MG 24 hr capsule Take by mouth every morning.      ? atorvastatin (LIPITOR) 10 MG tablet Take 10 mg by mouth at bedtime.  0  ? eszopiclone (LUNESTA) 2 MG TABS tablet     ? fluticasone (FLONASE) 50 MCG/ACT nasal spray 2 sprays by Nasal route daily.      ? losartan-hydrochlorothiazide (HYZAAR) 100-12.5 MG per tablet Takes one half of tablet    ? meloxicam (MOBIC) 7.5 MG tablet Take 1 tablet by mouth daily.    ? tamsulosin (FLOMAX) 0.4 MG CAPS capsule Take 0.4 mg by mouth at bedtime.  0  ? amphetamine-dextroamphetamine (ADDERALL) 10 MG tablet take 1 tablet by mouth daily if needed  0  ? desloratadine (CLARINEX) 5 MG tablet Take by mouth daily as needed.      ? EPINEPHrine (EPI-PEN) 0.3 mg/0.3 mL SOAJ injection Inject 0.3 mLs (0.3 mg total) into the  muscle once. (Patient not taking: Reported on 05/11/2021) 1 Device 11  ? fexofenadine (ALLEGRA) 180 MG tablet Take by mouth daily as needed.      ? ibuprofen (ADVIL) 400 MG tablet Take by mouth.    ? NONFORMULARY OR COMPOUNDED ITEM Allergy Vaccine ?1:10 ?Given at [facility] (Patient not taking: Reported on 05/11/2021)    ? VOLTAREN 1 % GEL Apply as directed    ? ?Current Facility-Administered Medications  ?Medication Dose Route Frequency Provider Last Rate Last Admin  ? 0.9 %  sodium chloride infusion  500 mL Intravenous Once Ladene Artist, MD      ? ? ?Allergies as of 05/25/2021 - Review Complete 05/25/2021  ?Allergen Reaction Noted  ? Codeine Itching   ? Meperidine hcl  Nausea And Vomiting   ? ? ?Family History  ?Problem Relation Age of Onset  ? Allergies Mother   ? Allergies Father   ? Prostate cancer Father   ? Skin cancer Father   ? Colon cancer Neg Hx   ? Esophageal cancer Neg Hx   ? Stomach cancer Neg Hx   ? Colon polyps Neg Hx   ? Rectal cancer Neg Hx   ? ? ?Social History  ? ?Socioeconomic History  ? Marital status: Married  ?  Spouse name: Not on file  ? Number of children: Not on file  ? Years of education: Not on file  ? Highest education level: Not on file  ?Occupational History  ? Occupation: minister  ?  Employer: [employer]  ?  Comment: active travel  ?Tobacco Use  ? Smoking status: Never  ? Smokeless tobacco: Never  ?Vaping Use  ? Vaping Use: Never used  ?Substance and Sexual Activity  ? Alcohol use: Yes  ?  Alcohol/week: 2.0 standard drinks  ?  Types: 2 Cans of beer per week  ?  Comment: 2 beers weekly  ? Drug use: No  ? Sexual activity: Not on file  ?Other Topics Concern  ? Not on file  ?Social History Narrative  ? Not on file  ? ?Social Determinants of Health  ? ?Financial Resource Strain: Not on file  ?Food Insecurity: Not on file  ?Transportation Needs: Not on file  ?Physical Activity: Not on file  ?Stress: Not on file  ?Social Connections: Not on file  ?Intimate Partner Violence: Not on file  ? ? ?Review of Systems: ? ?All systems reviewed an negative except where noted in HPI. ? ?Gen: Denies any fever, chills, sweats, anorexia, fatigue, weakness, malaise, weight loss, and sleep disorder ?CV: Denies chest pain, angina, palpitations, syncope, orthopnea, PND, peripheral edema, and claudication. ?Resp: Denies dyspnea at rest, dyspnea with exercise, cough, sputum, wheezing, coughing up blood, and pleurisy. ?GI: Denies vomiting blood, jaundice, and fecal incontinence.   Denies dysphagia or odynophagia. ?GU : Denies urinary burning, blood in urine, urinary frequency, urinary hesitancy, nocturnal urination, and urinary incontinence. ?MS: Denies joint pain,  limitation of movement, and swelling, stiffness, low back pain, extremity pain. Denies muscle weakness, cramps, atrophy.  ?Derm: Denies rash, itching, dry skin, hives, moles, warts, or unhealing ulcers.  ?Psych: Denies depression, anxiety, memory loss, suicidal ideation, hallucinations, paranoia, and confusion. ?Heme: Denies bruising, bleeding, and enlarged lymph nodes. ?Neuro:  Denies any headaches, dizziness, paresthesias. ?Endo:  Denies any problems with DM, thyroid, adrenal function. ? ? ?Physical Exam: ?General:  Alert, well-developed, in NAD ?Head:  Normocephalic and atraumatic. ?Eyes:  Sclera clear, no icterus.   Conjunctiva pink. ?Ears:  Normal auditory  acuity. ?Mouth:  No deformity or lesions.  ?Neck:  Supple; no masses . ?Lungs:  Clear throughout to auscultation.   No wheezes, crackles, or rhonchi. No acute distress. ?Heart:  Regular rate and rhythm; no murmurs. ?Abdomen:  Soft, nondistended, nontender. No masses, hepatomegaly. No obvious masses.  Normal bowel .    ?Rectal:  Deferred  to colonoscopy  ?Msk:  Symmetrical without gross deformities.Marland Kitchen ?Pulses:  Normal pulses noted. ?Extremities:  Without edema. ?Neurologic:  Alert and  oriented x4;  grossly normal neurologically. ?Skin:  Intact without significant lesions or rashes. ?Cervical Nodes:  No significant cervical adenopathy. ?Psych:  Alert and cooperative. Normal mood and affect. ? ? ?Impression / Plan:  ? ?CRC screening, average risk, for colonoscopy. ? ?Christopher Wilcox T. Fuller Plan  05/25/2021, 8:06 AM ?See Shea Evans, [facility], to contact our on call provider ? ? ?  ?

## 2021-05-25 NOTE — Op Note (Signed)
[facility] ?Patient Name: Christopher Wilcox ?Procedure Date: 05/25/2021 7:49 AM ?MRN: [id number] ?Endoscopist: Ladene Artist , MD ?Age: 71 ?Referring MD:  ?Date of Birth: 1950/08/26 ?Gender: Male ?Account #: [id number] ?Procedure:                Colonoscopy ?Indications:              Screening for colorectal malignant neoplasm ?Medicines:                Monitored Anesthesia Care ?Procedure:                Pre-Anesthesia Assessment: ?                          - Prior to the procedure, a History and Physical  ?                          was performed, and patient medications and  ?                          allergies were reviewed. The patient's tolerance of  ?                          previous anesthesia was also reviewed. The risks  ?                          and benefits of the procedure and the sedation  ?                          options and risks were discussed with the patient.  ?                          All questions were answered, and informed consent  ?                          was obtained. Prior Anticoagulants: The patient has  ?                          taken no previous anticoagulant or antiplatelet  ?                          agents. ASA Grade Assessment: II - A patient with  ?                          mild systemic disease. After reviewing the risks  ?                          and benefits, the patient was deemed in  ?                          satisfactory condition to undergo the procedure. ?                          After obtaining informed consent, the colonoscope  ?  was passed under direct vision. Throughout the  ?                          procedure, the patient's blood pressure, pulse, and  ?                          oxygen saturations were monitored continuously. The  ?                          Olympus CF-HQ190L (Serial# [id number]) Colonoscope was  ?                          introduced through the anus and advanced to the the  ?                          cecum,  identified by appendiceal orifice and  ?                          ileocecal valve. The ileocecal valve, appendiceal  ?                          orifice, and rectum were photographed. The quality  ?                          of the bowel preparation was adequate. The  ?                          colonoscopy was performed without difficulty. The  ?                          patient tolerated the procedure well. ?Scope In: 8:12:04 AM ?Scope Out: 8:26:23 AM ?Scope Withdrawal Time: 0 hours 11 minutes 55 seconds  ?Total Procedure Duration: 0 hours 14 minutes 19 seconds  ?Findings:                 The perianal and digital rectal examinations were  ?                          normal. ?                          Two sessile polyps were found in the transverse  ?                          colon. The polyps were 5 to 6 mm in size. These  ?                          polyps were removed with a cold snare. Resection  ?                          and retrieval were complete. ?                          Scattered medium-mouthed diverticula were found in  ?  the right colon. There was no evidence of  ?                          diverticular bleeding. ?                          Multiple medium-mouthed diverticula were found in  ?                          the left colon. There was no evidence of  ?                          diverticular bleeding. ?                          The exam was otherwise without abnormality on  ?                          direct and retroflexion views. ?Complications:            No immediate complications. Estimated blood loss:  ?                          None. ?Estimated Blood Loss:     Estimated blood loss: none. ?Impression:               - Two 5 to 6 mm polyps in the transverse colon,  ?                          removed with a cold snare. Resected and retrieved. ?                          - Mild diverticulosis in the right colon. ?                          - Moderate diverticulosis in the left  colon. ?                          - The examination was otherwise normal on direct  ?                          and retroflexion views. ?Recommendation:           - Repeat colonoscopy vs no repeat colonoscopy due  ?                          to age after studies are complete for surveillance  ?                          based on pathology results. ?                          - Patient has a contact number available for  ?                          emergencies. The signs and symptoms of potential  ?  delayed complications were discussed with the  ?                          patient. Return to normal activities tomorrow.  ?                          Written discharge instructions were provided to the  ?                          patient. ?                          - High fiber diet. ?                          - Continue present medications. ?                          - Await pathology results. ?Ladene Artist, MD ?05/25/2021 8:30:46 AM ?This report has been signed electronically. ?

## 2021-05-25 NOTE — Progress Notes (Signed)
Called to room to assist during endoscopic procedure.  Patient ID and intended procedure confirmed with present staff. Received instructions for my participation in the procedure from the performing physician.  

## 2021-05-25 NOTE — Patient Instructions (Signed)
Follow a high fiber diet. Resume previous medications. Awaiting pathology results. Repeat Colonoscopy date to be determined based on pathology results. ? ?YOU HAD AN ENDOSCOPIC PROCEDURE TODAY AT [facility]:   Refer to the procedure report that was given to you for any specific questions about what was found during the examination.  If the procedure report does not answer your questions, please call your gastroenterologist to clarify.  If you requested that your care partner not be given the details of your procedure findings, then the procedure report has been included in a sealed envelope for you to review at your convenience later. ? ?YOU SHOULD EXPECT: Some feelings of bloating in the abdomen. Passage of more gas than usual.  Walking can help get rid of the air that was put into your GI tract during the procedure and reduce the bloating. If you had a lower endoscopy (such as a colonoscopy or flexible sigmoidoscopy) you may notice spotting of blood in your stool or on the toilet paper. If you underwent a bowel prep for your procedure, you may not have a normal bowel movement for a few days. ? ?Please Note:  You might notice some irritation and congestion in your nose or some drainage.  This is from the oxygen used during your procedure.  There is no need for concern and it should clear up in a day or so. ? ?SYMPTOMS TO REPORT IMMEDIATELY: ? ?Following lower endoscopy (colonoscopy or flexible sigmoidoscopy): ? Excessive amounts of blood in the stool ? Significant tenderness or worsening of abdominal pains ? Swelling of the abdomen that is new, acute ? Fever of 100?F or higher ? ?For urgent or emergent issues, a gastroenterologist can be reached at any hour by calling [phone]. ?Do not use MyChart messaging for urgent concerns.  ? ? ?DIET:  We do recommend a small meal at first, but then you may proceed to your regular diet.  Drink plenty of fluids but you should avoid alcoholic beverages  for 24 hours. ? ?ACTIVITY:  You should plan to take it easy for the rest of today and you should NOT DRIVE or use heavy machinery until tomorrow (because of the sedation medicines used during the test).   ? ?FOLLOW UP: ?Our staff will call the number listed on your records 48-72 hours following your procedure to check on you and address any questions or concerns that you may have regarding the information given to you following your procedure. If we do not reach you, we will leave a message.  We will attempt to reach you two times.  During this call, we will ask if you have developed any symptoms of COVID 19. If you develop any symptoms (ie: fever, flu-like symptoms, shortness of breath, cough etc.) before then, please call [phone].  If you test positive for Covid 19 in the 2 weeks post procedure, please call and report this information to [place].   ? ?If any biopsies were taken you will be contacted by phone or by letter within the next 1-3 weeks.  Please call us at [phone] if you have not heard about the biopsies in 3 weeks.  ? ? ?SIGNATURES/CONFIDENTIALITY: ?You and/or your care partner have signed paperwork which will be entered into your electronic medical record.  These signatures attest to the fact that that the information above on your After Visit Summary has been reviewed and is understood.  Full responsibility of the confidentiality of this discharge information lies with you and/or  your care-partner.  ?

## 2021-05-25 NOTE — Progress Notes (Signed)
Vital signs checked by:CW ? ?The patient states no changes in medical or surgical history since pre-visit screening on 05/11/21. ? ? ? ?

## 2021-05-27 ENCOUNTER — Telehealth: Payer: Self-pay

## 2021-05-27 ENCOUNTER — Telehealth: Payer: Self-pay | Admitting: *Deleted

## 2021-05-27 NOTE — Telephone Encounter (Signed)
?  Follow up Call- ? ?Call back number 05/25/2021  ?Post procedure Call Back phone  # [phone]  ?Permission to leave phone message Yes  ?Some recent data might be hidden  ?  ?Second follow up call, LVM ? ?

## 2021-05-27 NOTE — Telephone Encounter (Signed)
?  Follow up Call- ? ?Call back number 05/25/2021  ?Post procedure Call Back phone  # [phone]  ?Permission to leave phone message Yes  ?Some recent data might be hidden  ?  ?First attempt for follow up phone call. No answer at number given.  Left message on voicemail.   ?

## 2021-06-08 ENCOUNTER — Encounter: Payer: Self-pay | Admitting: Gastroenterology

## 2021-08-31 DIAGNOSIS — H2513 Age-related nuclear cataract, bilateral: Secondary | ICD-10-CM | POA: Diagnosis not present

## 2021-09-04 IMAGING — US US EXTREM UP*L* LTD
2 series · 14 of 14 positions shown · non-contrast
Comparison: None.

CLINICAL DATA: Left small finger triggering.

EXAM:
ULTRASOUND LEFT UPPER EXTREMITY LIMITED
TECHNIQUE: Ultrasound examination of the upper extremity soft tissues was
performed in the area of clinical concern.

[Series 1: us extrem up*left* ltd · 0.04mm/px · 10 acquisitions, 10 frames shown (1 of 2)]
[im 1/10]
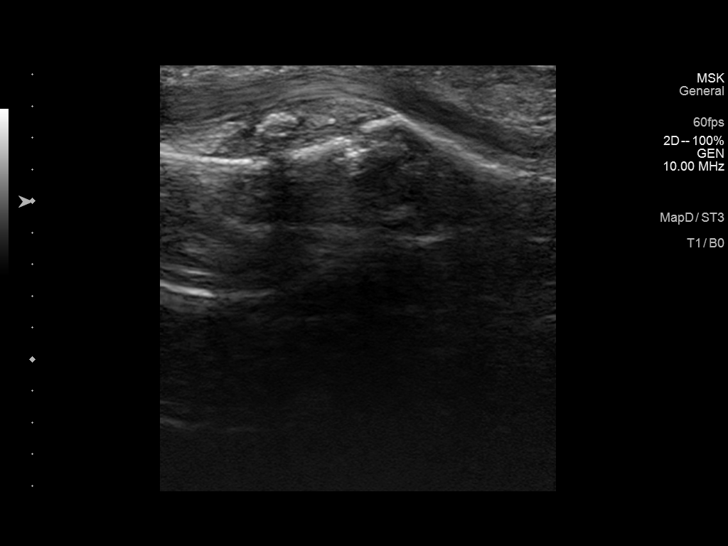
[im 2/10]
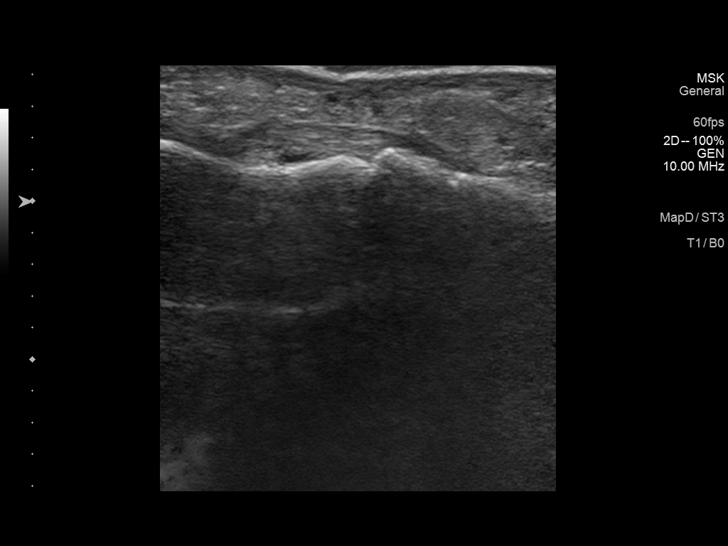
[im 3/10]
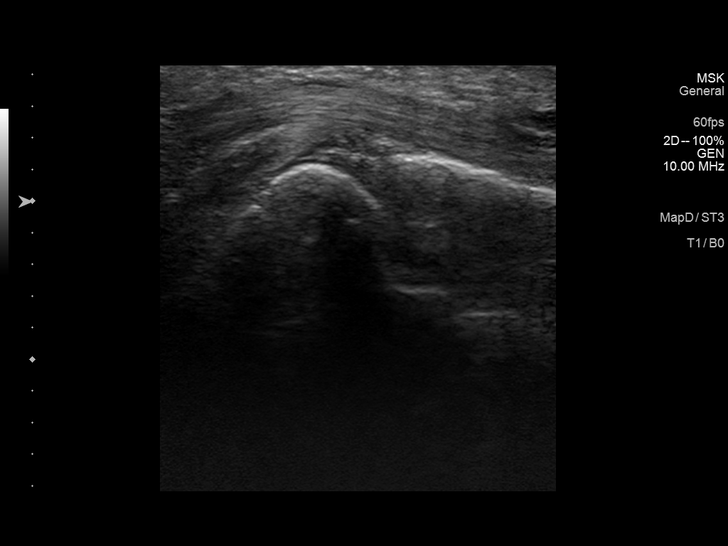
[im 4/10]
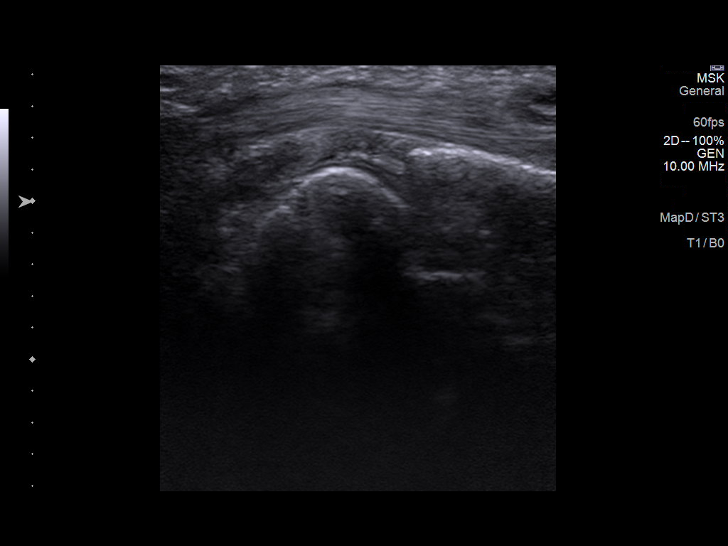
[im 5/10]
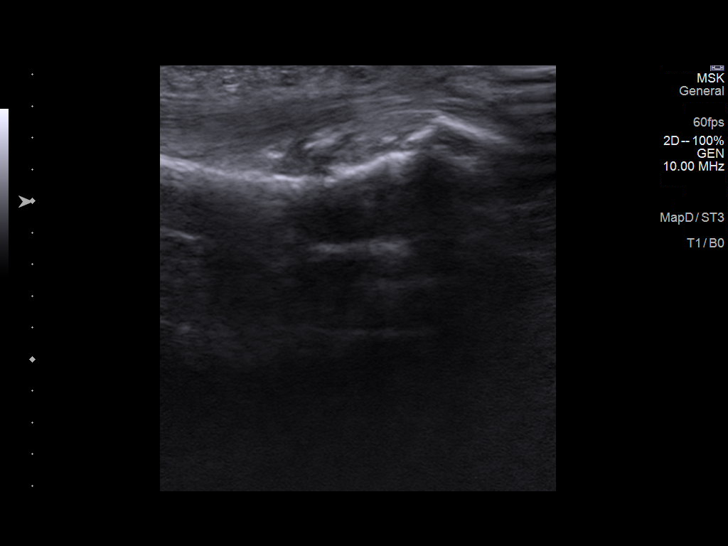
[im 6/10]
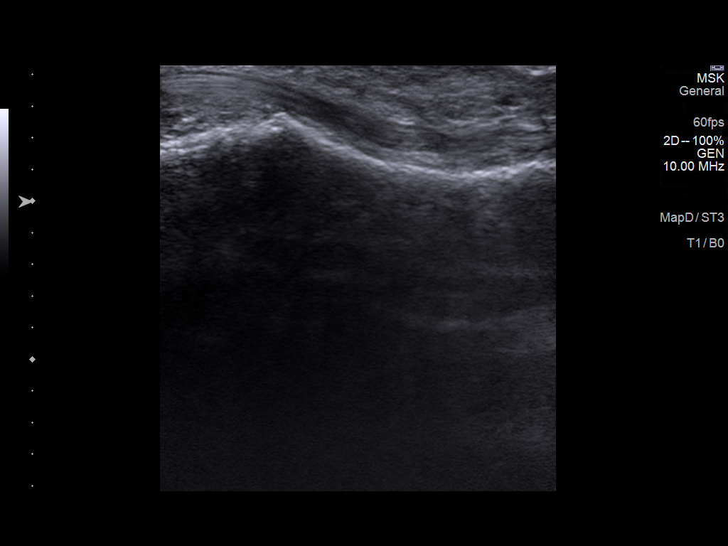
[im 7/10]
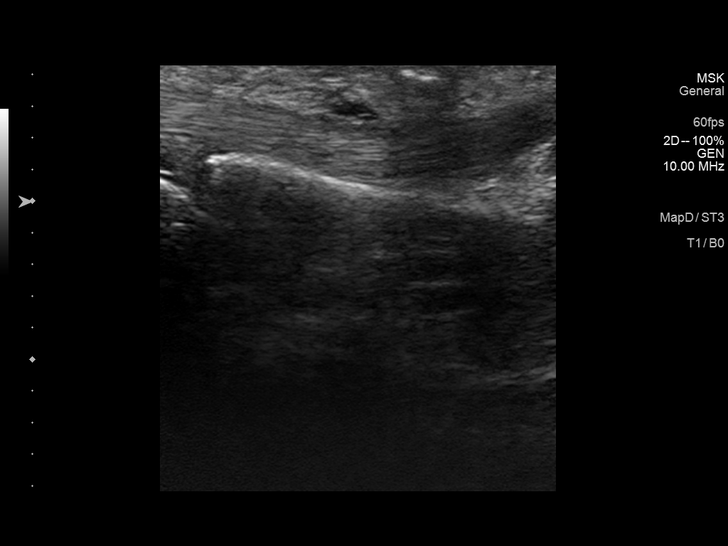
[im 8/10]
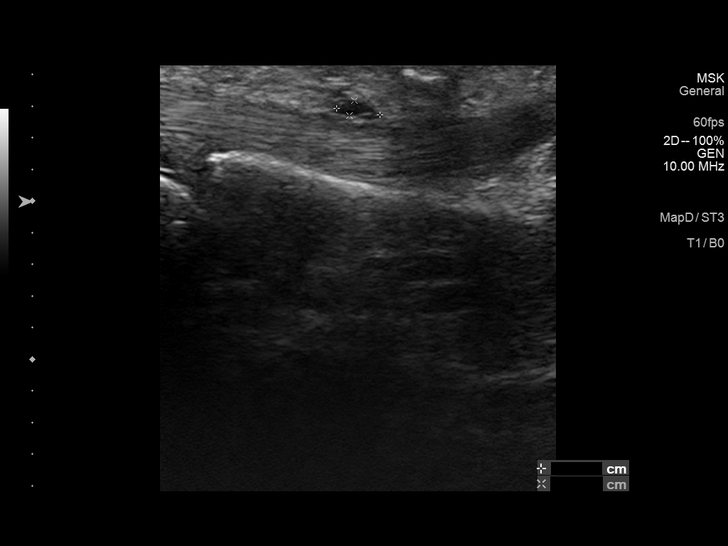
[im 9/10]
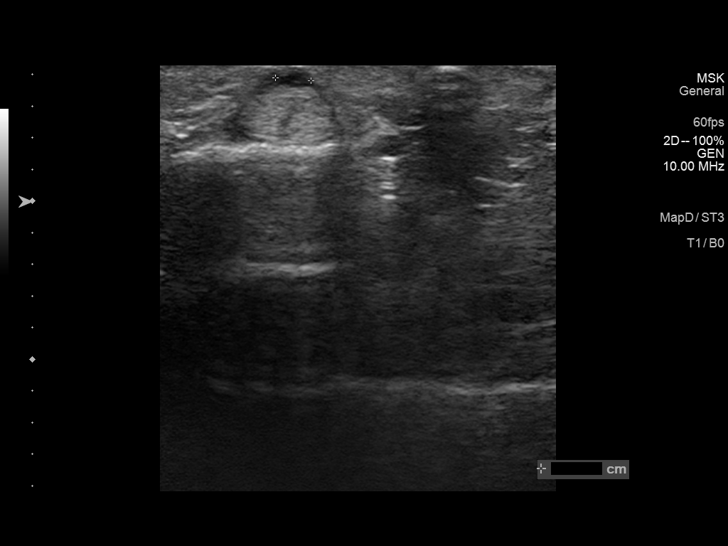
[im 10/10]
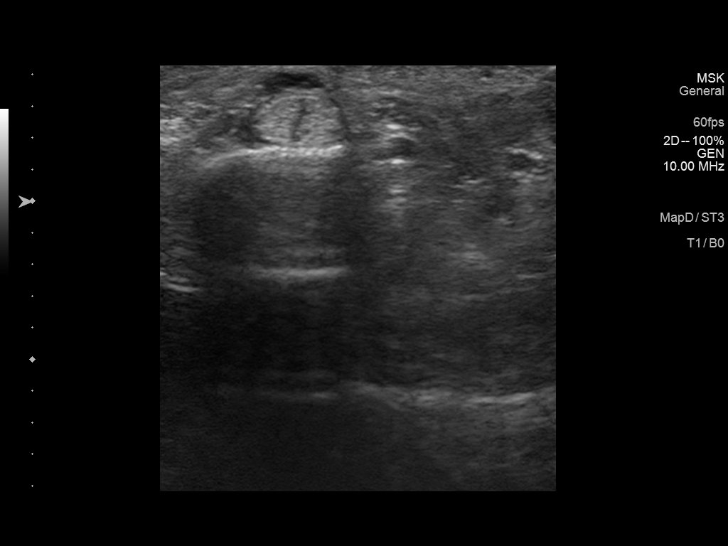

[Series 2: us extrem up*left* ltd · 0.04mm/px · 4 of 4 slices shown (2 of 2)]
[im 1/4]
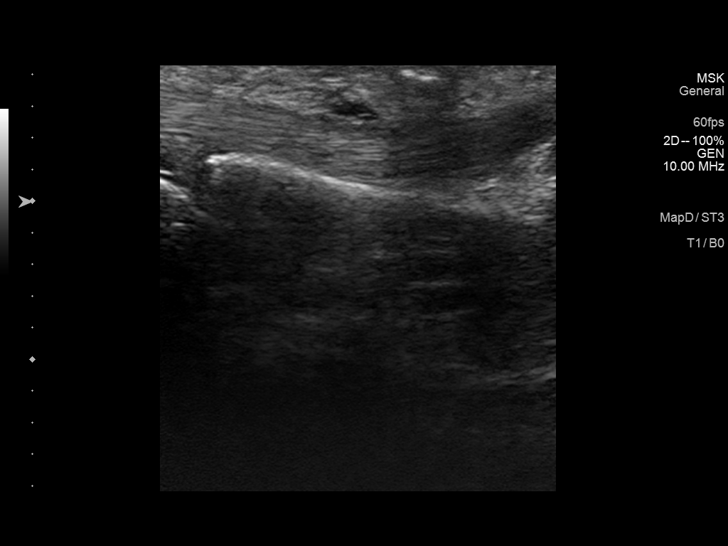
[im 2/4]
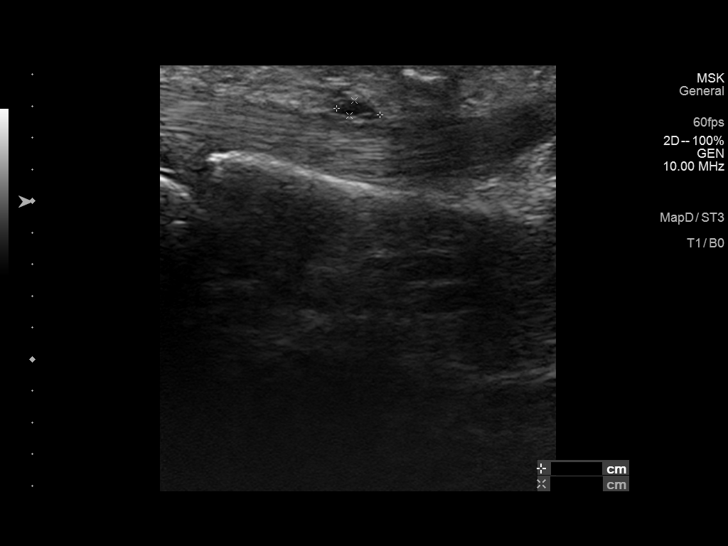
[im 3/4]
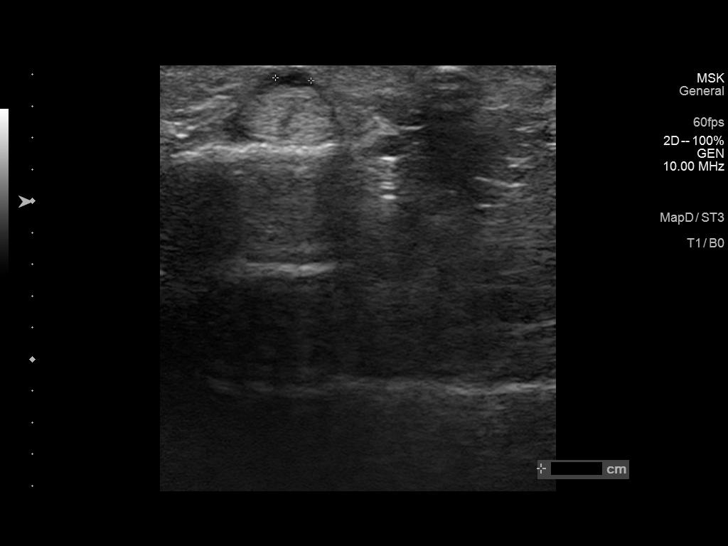
[im 4/4]
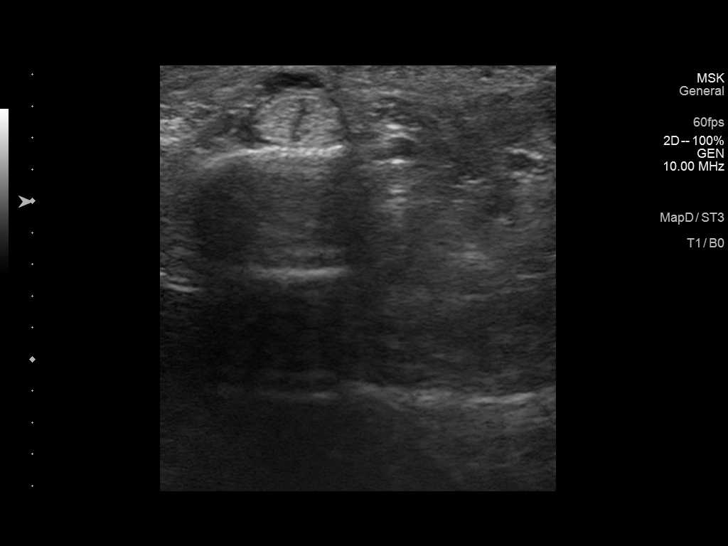

[14 of 14 positions shown; findings below may reference images not displayed]

FINDINGS: Focused ultrasound of the left small finger demonstrates intact
flexor superficialis and profundus tendons. There is thickening of
the A2 pulley with a 3 x 1 x 2 mm cyst at the pulley apex. This
causes the flexor tendons to catch during active flexion.
IMPRESSION: 1. Triggering of the left small finger due to thickening of the A2
pulley with an associated 3 mm ganglion cyst.

## 2022-01-27 DIAGNOSIS — E785 Hyperlipidemia, unspecified: Secondary | ICD-10-CM | POA: Diagnosis not present

## 2022-01-27 DIAGNOSIS — I10 Essential (primary) hypertension: Secondary | ICD-10-CM | POA: Diagnosis not present

## 2022-01-27 DIAGNOSIS — Z125 Encounter for screening for malignant neoplasm of prostate: Secondary | ICD-10-CM | POA: Diagnosis not present

## 2022-01-27 DIAGNOSIS — R7301 Impaired fasting glucose: Secondary | ICD-10-CM | POA: Diagnosis not present

## 2022-02-02 DIAGNOSIS — E785 Hyperlipidemia, unspecified: Secondary | ICD-10-CM | POA: Diagnosis not present

## 2022-02-02 DIAGNOSIS — I10 Essential (primary) hypertension: Secondary | ICD-10-CM | POA: Diagnosis not present

## 2022-02-02 DIAGNOSIS — Z0000 Encounter for general adult medical examination without abnormal findings: Secondary | ICD-10-CM | POA: Diagnosis not present

## 2022-02-02 DIAGNOSIS — R82998 Other abnormal findings in urine: Secondary | ICD-10-CM | POA: Diagnosis not present

## 2022-02-02 DIAGNOSIS — R7301 Impaired fasting glucose: Secondary | ICD-10-CM | POA: Diagnosis not present

## 2022-04-04 NOTE — Progress Notes (Signed)
Formatting of this note is different from the original.    Ross Parker is a 72 y.o. male  presents with chief complaint of Medicare Annual Wellness Visit Subsequent (Patient is here for a Medicare Annual Exam. Patient stopped taking Blood pressure medication. Patient slowly took himself off it, he has been off for about a month now.)     HPI:     HPI   PT IS HERE FOR WELLNESS EXAM AND DOING OK HE STOPPED METOPROLOL ABOUT A MONTHS AGO HE FELT MAYBE HE DID NOT NEED THE MEDS ,HE IS HAVING NO CP OR OSB HE HAD STRESS LAST YEAR AND WAS OK AND ECHO HE DOES NOT HAVE THE SAME STRESS HE HAD BEFORE,he IS HERE FOR WELKLKNESS EXAM,HE LSOT SOME WEIGHT BUT HIS SCHEDULE MEALS DIFFERENT NOW.  SUBJECTIVE:     MEDICATIONS:  Current Outpatient Medications   Medication Instructions    ASPIRIN 81 MG chewable tablet Every 24 hours    simvastatin (Zocor) 20 MG tablet TAKE 1 TABLET BY MOUTH EVERY DAY       ALLERGIES:  No Known Allergies     REVIEW OF SYMPTOMS:     Review of Systems   Constitutional: Negative.  Negative for activity change, appetite change, chills, diaphoresis, fatigue, fever and unexpected weight change.   HENT: Negative.  Negative for congestion, dental problem, drooling, ear discharge, ear pain, facial swelling, hearing loss, mouth sores, nosebleeds, postnasal drip, rhinorrhea, sinus pressure, sinus pain, sneezing, sore throat, tinnitus, trouble swallowing and voice change.    Eyes:  Negative for photophobia, pain, discharge, redness, itching and visual disturbance.   Respiratory:  Negative for apnea, cough, choking, chest tightness, shortness of breath, wheezing and stridor.    Cardiovascular:  Negative for chest pain, palpitations and leg swelling.   Gastrointestinal:  Negative for abdominal distention, abdominal pain, anal bleeding, blood in stool, constipation, diarrhea, nausea, rectal pain and vomiting.   Genitourinary:  Negative for decreased urine volume, difficulty urinating, dysuria, enuresis, flank pain,  frequency, genital sores, hematuria and urgency.   Musculoskeletal:  Negative for arthralgias, back pain, gait problem, joint swelling, myalgias, neck pain and neck stiffness.   Skin:  Negative for color change, pallor, rash and wound.   Neurological:  Negative for tremors, seizures, syncope, facial asymmetry, speech difficulty, weakness, light-headedness, numbness and headaches.   Psychiatric/Behavioral:  Negative for agitation, behavioral problems, confusion, decreased concentration, dysphoric mood, hallucinations, self-injury, sleep disturbance and suicidal ideas. The patient is not nervous/anxious and is not hyperactive.    Hematological:  Negative for adenopathy. Does not bruise/bleed easily.   Endocrine: Negative for cold intolerance, heat intolerance, polydipsia, polyphagia and polyuria.   Allergic/Immunologic: Negative for environmental allergies, food allergies and immunocompromised state.       OBJECTIVE:     Visit Vitals  BP 134/74 (BP Location: Left arm, Patient Position: Sitting, BP Cuff Size: Adult)   Pulse 90   Temp 98 F   Resp 17   Ht 6\' 1"    Wt 135 lb   SpO2 98%   BMI 17.81 kg/m   Smoking Status Never   BSA 1.78 m       Physical Exam   BP 134/74 (BP Location: Left arm, Patient Position: Sitting, BP Cuff Size: Adult)   Pulse 90   Temp 98 F   Resp 17   Ht 6\' 1"    Wt 135 lb   SpO2 98%   BMI 17.81 kg/m     General Appearance:    Alert, cooperative,  no distress, appears stated age   Head:    Normocephalic, without obvious abnormality, atraumatic   Eyes:    PERRL, conjunctiva/corneas clear, EOM's intact, fundi     benign, both eyes   Ears:    Normal TM's and external ear canals, both ears   Nose:   Nares normal, septum midline, mucosa normal, no drainage     or sinus tenderness   Throat:   Lips, mucosa, and tongue normal; teeth and gums normal   Neck:   Supple, symmetrical, trachea midline, no adenopathy;     thyroid:  no enlargement/tenderness/nodules; no carotid    bruit or JVD   Back:      Symmetric, no curvature, ROM normal, no CVA tenderness   Lungs:     Clear to auscultation bilaterally, respirations unlabored   Chest Wall:    No tenderness or deformity    Heart:    Regular rate and rhythm, S1 and S2 normal, no murmur, rub    or gallop   Breast Exam:    No tenderness, masses, or nipple abnormality   Abdomen:     Soft, non-tender, bowel sounds active all four quadrants,     no masses, no organomegaly     Rectal:    Normal tone, no masses or tenderness; guaiac negative stool PROSTATE SLIGHTLY ENLARGED HEMOCCULT IS NEGATIVE NO MASSES OR NODULES   Extremities:   Extremities normal, atraumatic, no cyanosis or edema   Pulses:   2+ and symmetric all extremities   Skin:   Skin color, texture, turgor normal, no rashes or lesions   Lymph nodes:   Cervical, supraclavicular, and axillary nodes normal   Neurologic:   CNII-XII intact, normal strength, sensation and reflexes     throughout       ASSESSMENT AND PLAN:     Assessment/Plan   Diagnoses and all orders for this visit:  Coronary artery disease involving native coronary artery of native heart without angina pectoris (CMS/HCC)  -     CBC and differential; Future  -     Basic metabolic panel; Future  -     Lipid panel; Future  -     Hepatic function panel; Future  -     PSA  -     Tsh+free t4  -     Vitamin D 25 hydroxy; Future  -     URINALYSIS, COMPLETE W/REFLEX TO CULTURE  Benign prostatic hyperplasia with nocturia  -     CBC and differential; Future  -     Basic metabolic panel; Future  -     Lipid panel; Future  -     Hepatic function panel; Future  -     PSA  -     Tsh+free t4  -     Vitamin D 25 hydroxy; Future  -     URINALYSIS, COMPLETE W/REFLEX TO CULTURE  Vitamin D deficiency  -     CBC and differential; Future  -     Basic metabolic panel; Future  -     Lipid panel; Future  -     Hepatic function panel; Future  -     PSA  -     Tsh+free t4  -     Vitamin D 25 hydroxy; Future  -     URINALYSIS, COMPLETE W/REFLEX TO CULTURE  Disorder of lipid  metabolism (CMS/HCC)  -     CBC and differential; Future  -  Basic metabolic panel; Future  -     Lipid panel; Future  -     Hepatic function panel; Future  -     PSA  -     Tsh+free t4  -     Vitamin D 25 hydroxy; Future  -     URINALYSIS, COMPLETE W/REFLEX TO CULTURE  Wellness examination  -     CBC and differential; Future  -     Basic metabolic panel; Future  -     Lipid panel; Future  -     Hepatic function panel; Future  -     PSA  -     Tsh+free t4  -     Vitamin D 25 hydroxy; Future  -     URINALYSIS, COMPLETE W/REFLEX TO CULTURE  Medication refill  -     CBC and differential; Future  -     Basic metabolic panel; Future  -     Lipid panel; Future  -     Hepatic function panel; Future  -     PSA  -     Tsh+free t4  -     Vitamin D 25 hydroxy; Future  -     URINALYSIS, COMPLETE W/REFLEX TO CULTURE  -     metoprolol tartrate (Lopressor) 25 MG tablet; Take 1 tablet (25 mg) by mouth in the morning.    PLAN  CONTINUE WITH MEDS  MAY START METOPROLOL 25MG  AT HS  DO LABS AND CALL FOR RESULTS  TYLENOL FOR PAIN  CALL IF ANY PRONBLEM  TAKE ASA 81MG  DAILY  FLUE VACCINE   HIS LAST TETANUS WAS IN OCTOBER OF 214 HE ELECTED TO WAIT FEW MONTHS BEFORE HE GETS A LITTLE      Electronically signed by Estrellita Ludwig, DO at 04/04/2022  1:44 PM EST

## 2022-05-02 DIAGNOSIS — K080 Exfoliation of teeth due to systemic causes: Secondary | ICD-10-CM | POA: Diagnosis not present

## 2022-08-23 NOTE — Telephone Encounter (Signed)
Formatting of this note might be different from the original.  Per pt would like results of labs done 2 months ago   Electronically signed by Blenda Mounts, MA at 08/23/2022 10:21 AM EDT

## 2022-08-29 DIAGNOSIS — K080 Exfoliation of teeth due to systemic causes: Secondary | ICD-10-CM | POA: Diagnosis not present

## 2022-09-14 DIAGNOSIS — N401 Enlarged prostate with lower urinary tract symptoms: Secondary | ICD-10-CM | POA: Diagnosis not present

## 2022-09-14 DIAGNOSIS — N138 Other obstructive and reflux uropathy: Secondary | ICD-10-CM | POA: Diagnosis not present

## 2022-11-22 DIAGNOSIS — N319 Neuromuscular dysfunction of bladder, unspecified: Secondary | ICD-10-CM | POA: Diagnosis not present

## 2022-11-22 DIAGNOSIS — L821 Other seborrheic keratosis: Secondary | ICD-10-CM | POA: Diagnosis not present

## 2022-11-22 DIAGNOSIS — R29898 Other symptoms and signs involving the musculoskeletal system: Secondary | ICD-10-CM | POA: Diagnosis not present

## 2022-11-22 DIAGNOSIS — R278 Other lack of coordination: Secondary | ICD-10-CM | POA: Diagnosis not present

## 2022-11-22 DIAGNOSIS — L570 Actinic keratosis: Secondary | ICD-10-CM | POA: Diagnosis not present

## 2022-11-22 DIAGNOSIS — C44719 Basal cell carcinoma of skin of left lower limb, including hip: Secondary | ICD-10-CM | POA: Diagnosis not present

## 2022-11-22 DIAGNOSIS — R39198 Other difficulties with micturition: Secondary | ICD-10-CM | POA: Diagnosis not present

## 2022-11-22 DIAGNOSIS — N3941 Urge incontinence: Secondary | ICD-10-CM | POA: Diagnosis not present

## 2022-11-22 DIAGNOSIS — L578 Other skin changes due to chronic exposure to nonionizing radiation: Secondary | ICD-10-CM | POA: Diagnosis not present

## 2022-11-22 DIAGNOSIS — R3911 Hesitancy of micturition: Secondary | ICD-10-CM | POA: Diagnosis not present

## 2022-11-22 DIAGNOSIS — N138 Other obstructive and reflux uropathy: Secondary | ICD-10-CM | POA: Diagnosis not present

## 2022-11-22 DIAGNOSIS — N401 Enlarged prostate with lower urinary tract symptoms: Secondary | ICD-10-CM | POA: Diagnosis not present

## 2022-12-04 DIAGNOSIS — N319 Neuromuscular dysfunction of bladder, unspecified: Secondary | ICD-10-CM | POA: Diagnosis not present

## 2022-12-04 DIAGNOSIS — R278 Other lack of coordination: Secondary | ICD-10-CM | POA: Diagnosis not present

## 2022-12-04 DIAGNOSIS — N3941 Urge incontinence: Secondary | ICD-10-CM | POA: Diagnosis not present

## 2022-12-04 DIAGNOSIS — N401 Enlarged prostate with lower urinary tract symptoms: Secondary | ICD-10-CM | POA: Diagnosis not present

## 2022-12-04 DIAGNOSIS — R29898 Other symptoms and signs involving the musculoskeletal system: Secondary | ICD-10-CM | POA: Diagnosis not present

## 2022-12-04 DIAGNOSIS — R3911 Hesitancy of micturition: Secondary | ICD-10-CM | POA: Diagnosis not present

## 2022-12-04 DIAGNOSIS — N138 Other obstructive and reflux uropathy: Secondary | ICD-10-CM | POA: Diagnosis not present

## 2022-12-04 DIAGNOSIS — R39198 Other difficulties with micturition: Secondary | ICD-10-CM | POA: Diagnosis not present

## 2022-12-18 DIAGNOSIS — L02416 Cutaneous abscess of left lower limb: Secondary | ICD-10-CM | POA: Diagnosis not present

## 2022-12-23 MED ORDER — SIMVASTATIN 20 MG PO TABS
20 MG | ORAL_TABLET | Freq: Every day | ORAL | 1 refills | Status: AC
Start: 2022-12-23 — End: 2023-01-16

## 2022-12-23 NOTE — Telephone Encounter (Signed)
Last ov was in 04/04/2022

## 2023-01-01 DIAGNOSIS — R278 Other lack of coordination: Secondary | ICD-10-CM | POA: Diagnosis not present

## 2023-01-01 DIAGNOSIS — R3911 Hesitancy of micturition: Secondary | ICD-10-CM | POA: Diagnosis not present

## 2023-01-01 DIAGNOSIS — N138 Other obstructive and reflux uropathy: Secondary | ICD-10-CM | POA: Diagnosis not present

## 2023-01-01 DIAGNOSIS — R39198 Other difficulties with micturition: Secondary | ICD-10-CM | POA: Diagnosis not present

## 2023-01-01 DIAGNOSIS — N401 Enlarged prostate with lower urinary tract symptoms: Secondary | ICD-10-CM | POA: Diagnosis not present

## 2023-01-01 DIAGNOSIS — R29898 Other symptoms and signs involving the musculoskeletal system: Secondary | ICD-10-CM | POA: Diagnosis not present

## 2023-01-01 DIAGNOSIS — N319 Neuromuscular dysfunction of bladder, unspecified: Secondary | ICD-10-CM | POA: Diagnosis not present

## 2023-01-01 DIAGNOSIS — N3941 Urge incontinence: Secondary | ICD-10-CM | POA: Diagnosis not present

## 2023-01-16 MED ORDER — SIMVASTATIN 20 MG PO TABS
20 | ORAL_TABLET | Freq: Every day | ORAL | 1 refills | Status: AC
Start: 2023-01-16 — End: ?

## 2023-01-16 NOTE — Telephone Encounter (Signed)
Simvastatin pending for refill

## 2023-01-18 ENCOUNTER — Ambulatory Visit
Admit: 2023-01-18 | Discharge: 2023-01-18 | Payer: MEDICARE | Attending: Emergency Medicine | Primary: Emergency Medicine

## 2023-01-18 DIAGNOSIS — Z23 Encounter for immunization: Secondary | ICD-10-CM

## 2023-01-18 NOTE — Progress Notes (Signed)
After obtaining consent, and per orders of Dr. Domingo Dimes, injection of flu given in Right deltoid by Sherrell Puller, MA. Patient instructed to remain in clinic for 20 minutes afterwards, and to report any adverse reaction to me immediately.

## 2023-01-24 MED ORDER — METOPROLOL TARTRATE 25 MG PO TABS
25 | ORAL_TABLET | Freq: Every day | ORAL | 1 refills | Status: AC
Start: 2023-01-24 — End: ?

## 2023-01-24 NOTE — Telephone Encounter (Signed)
Request for metoprolol.      Next Visit Date:  Future Appointments   Date Time Provider Department Center   03/22/2023 11:00 AM Odeh, Neiman T, DO [place] [facility] DE[facility]       Health Maintenance   Topic Date Due    Depression Screen  Never done    Hepatitis C screen  Never done    DTaP/Tdap/Td vaccine (1 - Tdap) Never done    Shingles vaccine (1 of 2) Never done    Respiratory Syncytial Virus (RSV) Pregnant or age 21 yrs+ (1 - 1-dose 60+ series) Never done    Colorectal Cancer Screen  03/04/2021    Pneumococcal 65+ years Vaccine (2 of 2 - PCV) 03/16/2021    Lipids  06/02/2021    Annual Wellness Visit (Medicare)  Never done    COVID-19 Vaccine (2 - 2023-24 season) 11/19/2022    Flu vaccine  Completed    Hepatitis A vaccine  Aged Out    Hepatitis B vaccine  Aged Out    Hib vaccine  Aged Out    Polio vaccine  Aged Out    Meningococcal (ACWY) vaccine  Aged Out       No results found for: "LABA1C"          ( goal A1C is < 7)   No components found for: "LABMICR"  No components found for: "LDLCHOLESTEROL", "LDLCALC"    (goal LDL is <100)   AST (U/L)   Date Value   06/02/2020 15     ALT (U/L)   Date Value   06/02/2020 16     BUN (mg/dL)   Date Value   84/16/6063 16     BP Readings from Last 3 Encounters:   No data found for BP          (goal 120/80)    All Future Testing planned in [facility]  Lab Frequency Next Occurrence   Nasal cannula oxygen PRN          There is no problem list on file for this patient.

## 2023-02-19 DIAGNOSIS — E785 Hyperlipidemia, unspecified: Secondary | ICD-10-CM | POA: Diagnosis not present

## 2023-02-19 DIAGNOSIS — N401 Enlarged prostate with lower urinary tract symptoms: Secondary | ICD-10-CM | POA: Diagnosis not present

## 2023-02-19 DIAGNOSIS — R7301 Impaired fasting glucose: Secondary | ICD-10-CM | POA: Diagnosis not present

## 2023-02-26 DIAGNOSIS — I77810 Thoracic aortic ectasia: Secondary | ICD-10-CM | POA: Diagnosis not present

## 2023-02-26 DIAGNOSIS — Z1339 Encounter for screening examination for other mental health and behavioral disorders: Secondary | ICD-10-CM | POA: Diagnosis not present

## 2023-02-26 DIAGNOSIS — R82998 Other abnormal findings in urine: Secondary | ICD-10-CM | POA: Diagnosis not present

## 2023-02-26 DIAGNOSIS — Z23 Encounter for immunization: Secondary | ICD-10-CM | POA: Diagnosis not present

## 2023-02-26 DIAGNOSIS — Z1331 Encounter for screening for depression: Secondary | ICD-10-CM | POA: Diagnosis not present

## 2023-02-26 DIAGNOSIS — I10 Essential (primary) hypertension: Secondary | ICD-10-CM | POA: Diagnosis not present

## 2023-03-22 ENCOUNTER — Ambulatory Visit
Admit: 2023-03-22 | Discharge: 2023-03-22 | Payer: PRIVATE HEALTH INSURANCE | Attending: Emergency Medicine | Admitting: Emergency Medicine | Primary: Emergency Medicine

## 2023-03-22 VITALS — BP 132/84 | HR 67 | Temp 98.00000°F | Resp 16 | Ht 72.0 in | Wt 136.0 lb

## 2023-03-22 DIAGNOSIS — L720 Epidermal cyst: Principal | ICD-10-CM

## 2023-03-22 NOTE — Progress Notes (Signed)
 [facility]  [facility] [place]  FAMILY MEDICINE  [address]  [place]  [place] [id number]  Dept: [phone]     Date of Visit:  03/22/2023  Patient Name: Ross Parker   Patient DOB:  [birth year]     Ross Parker is a 73 y.o. male who presents today for an general visit to be evaluated for the following condition(s):  Chief Complaint   Patient presents with    New Patient     New patient. Concerns for lump or cyst on neck. Concerns that its from a flea bite.        SUBJECTIVE:  HPI   PT IS HERE FOR ESTABLISHING CARE FROM [facility] HE HAS HAD A CYST ON HIS LEFT NECK HE HAS 12 CATS AND HE [facility]ENT TO [facility] AND THEY HAD FLEAS IN JULY AND HE HAD 4 CATS DIED AND HE HAD SCAB ON HIS ARM AND HE HAD LEFT NECK CYST IN AUGUST NO PAIN OR DRAINAGE ,NO FEVER OR CHILLS ,HE IS USING TOPICAL AB ON IT.HE IS NOT TIRED AND NO WEIGHT LOSS.  Patient stated he still treating his gets since the summer but since August that left arm bite what he described has resolved.  Current Outpatient Medications   Medication Sig Dispense Refill    metoprolol  tartrate (LOPRESSOR ) 25 MG tablet Take 1 tablet by mouth daily 60 tablet 1    simvastatin  (ZOCOR ) 20 MG tablet TAKE 1 TABLET BY MOUTH EVERY DAY 90 tablet 1    aspirin 81 MG chewable tablet every 24 hours       No current facility-administered medications for this visit.      No Known Allergies     OBJECTIVE:  Patient Active Problem List   Diagnosis    Atherosclerosis of native coronary artery of native heart without angina pectoris    Disorder of lipid metabolism    Benign prostatic hyperplasia with lower urinary tract symptoms    Vitamin D deficiency    Coronary artery disease involving native coronary artery of native heart without angina pectoris    History of nocturia    Encounter for issue of repeat prescription     No past medical history on file.  No past surgical history on file.   Social History     Socioeconomic History    Marital status: Married   Tobacco Use    Smoking status:  Never    Smokeless tobacco: Never     Social Determinants of Health     Financial Resource Strain: Low Risk  (03/22/2023)    Overall Financial Resource Strain (CARDIA)     Difficulty of Paying Living Expenses: Not hard at all   Food Insecurity: No Food Insecurity (03/22/2023)    Hunger Vital Sign     Worried About Running Out of Food in the Last Year: Never true     Ran Out of Food in the Last Year: Never true   Transportation Needs: Unknown (03/22/2023)    PRAPARE - Transportation     Lack of Transportation (Non-Medical): No   Housing Stability: Unknown (03/22/2023)    Housing Stability Vital Sign     Homeless in the Last Year: No        Review of Systems  Constitutional: Negative. Negative for activity change, appetite change, chills, diaphoresis, fatigue, fever and unexpected weight changes   HENT:  Negative. Negative for congestion, dental problem, drooling, ear discharge, ear pain, facial swelling, hearing loss, mouth sores, nosebleeds,  postnasal drip, rhinorrhea, sinus, pressure, sinus pain, sneezing, sore throat, tinnitus, trouble swallowing and voice change   Eyes:  Negative for photophobia, pain, discharge, redness, itching and visual disturbance.   Respiratory: Negative for apnea, cough, choking, chest tightness, shortness of breath, wheezing and stridor.   Cardiovascular: Negative for chest pain, palpitations and leg swelling   Gastrointestinal: Negative for abdominal distention, abdominal pain, anal bleeding, blood in stool, constipation, diarrhea, nausea, rectal pain and vomiting.   Genitourinary:   Negative for decreased urine volume, difficulty urinating, dysuria, enuresis, flank pain, frequency, genital sores, hematuria and urgency.   Musculoskeletal:   Negative for arthralgias, back pain, gait problem, joint swelling, myalgias, neck pain and neck stiffness   Skin:  Negative for color change, pallor, rash and wound.   Neurological:   Negative for tremors, seizures, syncope, facial asymmetry, speech  difficulty, weakness, light-headedness, numbness and headaches.   Psychiatric/Behavior: Negative for agitation, behavioral problems, confusion, decreased concentration, dysphoric mood, hallucinations, self-injury, sleep disturbance and suicidal ideas.  The patient is not nervous/anxious and is not hyperactive.   Hematological: Negative for adenopathy.  Does not bruise/bleed easily.   Endocrine: Negative for cold intolerance, heat intolerance, polydipsia, polyphagia and polyuria   Allergic/Immunologic: Negative for environmental allergies, food allergies and immunocompromised state.   Vitals:  BP 132/84   Pulse 67   Temp 98 F (36.7 C)   Resp 16   Ht 1.829 m (6')   Wt 61.7 kg (136 lb)   SpO2 98%   BMI 18.44 kg/m      Physical Exam            General:   Head: Alert, cooperative, no distress, appears stated age.  Normocephalic, without obvious abnormality, atraumatic   Eyes:  Conjunctivae/corneas clear. PERRL, EOMs intact. Fundi benign   Ears:  Normal TMs and external ear canals both ears.   Nose: Nares normal. Septum midline. Mucosa normal. No drainage or sinus tenderness.   Mouth/Throat: Lips, mucosa, and tongue normal. Teeth and gums normal.   Neck: Supple, symmetrical, trachea midline, no adenopathy, thyroid: no enlargement/tenderness/nodules, no carotid bruit and no JVD.LEFT NECK HAS SMALL EPIDERMOID CYST SUPERFICIAL NO LYMPHADENOPATHY and as the cyst is not infected or need any intervention except monitor   Back:   Symmetric, no curvature. ROM normal. No CVA tenderness.   Lungs:   Clear to auscultation bilaterally.   Heart:  Regular rate and rhythm, S1, S2 normal, no murmur, click, rub or gallop.   Abdomen:   Soft, non-tender. Bowel sounds normal. No masses, No organomegaly.   Extremities: Extremities normal, atraumatic, no cyanosis or edema.   Pulses: 2+ and symmetric all extremities.  No axillary lymphadenopathy were noted on my exam   Skin: Skin color, texture, turgor normal. No rashes or lesions    Lymph nodes: Cervical, supraclavicular, and axillary nodes normal.   Neurologic: CNII-XII intact. Normal strength, sensation, and reflexes throughout.     ASSESSMENT/PLAN:  1. Epidermoid cyst of neck  -     CBC with Auto Differential; Future  -     Basic Metabolic Panel; Future  -     Lipid, Fasting; Future  -     Hepatic Function Panel; Future  -     PSA Screening; Future  -     TSH reflex to FT4; Future  -     Uric Acid; Future  -     Urinalysis with Reflex to Culture; Future  -     Vitamin D 25 Hydroxy;  Future  2. Atherosclerosis of native coronary artery of native heart without angina pectoris  -     CBC with Auto Differential; Future  -     Basic Metabolic Panel; Future  -     Lipid, Fasting; Future  -     Hepatic Function Panel; Future  -     PSA Screening; Future  -     TSH reflex to FT4; Future  -     Uric Acid; Future  -     Urinalysis with Reflex to Culture; Future  -     Vitamin D 25 Hydroxy; Future  3. Coronary artery disease involving native coronary artery of native heart without angina pectoris  -     CBC with Auto Differential; Future  -     Basic Metabolic Panel; Future  -     Lipid, Fasting; Future  -     Hepatic Function Panel; Future  -     PSA Screening; Future  -     TSH reflex to FT4; Future  -     Uric Acid; Future  -     Urinalysis with Reflex to Culture; Future  -     Vitamin D 25 Hydroxy; Future  4. Benign prostatic hyperplasia with nocturia  -     CBC with Auto Differential; Future  -     Basic Metabolic Panel; Future  -     Lipid, Fasting; Future  -     Hepatic Function Panel; Future  -     PSA Screening; Future  -     TSH reflex to FT4; Future  -     Uric Acid; Future  -     Urinalysis with Reflex to Culture; Future  -     Vitamin D 25 Hydroxy; Future  5. Disorder of lipid metabolism  -     CBC with Auto Differential; Future  -     Basic Metabolic Panel; Future  -     Lipid, Fasting; Future  -     Hepatic Function Panel; Future  -     PSA Screening; Future  -     TSH reflex to FT4;  Future  -     Uric Acid; Future  -     Urinalysis with Reflex to Culture; Future  -     Vitamin D 25 Hydroxy; Future  6. Vitamin D deficiency  -     CBC with Auto Differential; Future  -     Basic Metabolic Panel; Future  -     Lipid, Fasting; Future  -     Hepatic Function Panel; Future  -     PSA Screening; Future  -     TSH reflex to FT4; Future  -     Uric Acid; Future  -     Urinalysis with Reflex to Culture; Future  -     Vitamin D 25 Hydroxy; Future  7. Well adult exam  -     CBC with Auto Differential; Future  -     Basic Metabolic Panel; Future  -     Lipid, Fasting; Future  -     Hepatic Function Panel; Future  -     PSA Screening; Future  -     TSH reflex to FT4; Future  -     Uric Acid; Future  -     Urinalysis with Reflex to Culture; Future  -  Vitamin D 25 Hydroxy; Future  -     Vitamin B12 & Folate; Future       Return in about 3 months (around 06/20/2023), or if symptoms worsen or fail to improve, for For wellness.    PLAN:  MONITOR CYST  IF ANY REDNESS OR INFECTION RETURN FOR I@d   WARM MOIST HEAT  TYLENOL FOR PAIN  CALL IF ANY PROBLEM  Otherwise patient is due for blood work in April and to come back for his wellness exam and anytime in between if any changes in the cyst  Please note that this chart was generated using voice recognition Scientist, clinical (histocompatibility and immunogenetics). Although every effort was made to ensure the accuracy of this automatedtranscription, some errors in transcription may have occurred.     Electronically signed by MELVEN ROVER T., DO FAA[facility] DAB[facility] , 03/22/2023, 1:26 PM

## 2023-03-24 MED ORDER — METOPROLOL TARTRATE 25 MG PO TABS
25 | ORAL_TABLET | Freq: Every day | ORAL | 1 refills | 30.00000 days | Status: DC
Start: 2023-03-24 — End: 2024-03-24

## 2023-03-29 DIAGNOSIS — K080 Exfoliation of teeth due to systemic causes: Secondary | ICD-10-CM | POA: Diagnosis not present

## 2023-04-02 DIAGNOSIS — G4733 Obstructive sleep apnea (adult) (pediatric): Secondary | ICD-10-CM | POA: Diagnosis not present

## 2023-04-02 DIAGNOSIS — R0981 Nasal congestion: Secondary | ICD-10-CM | POA: Diagnosis not present

## 2023-04-02 DIAGNOSIS — I10 Essential (primary) hypertension: Secondary | ICD-10-CM | POA: Diagnosis not present

## 2023-04-12 DIAGNOSIS — N138 Other obstructive and reflux uropathy: Secondary | ICD-10-CM | POA: Diagnosis not present

## 2023-04-12 DIAGNOSIS — N401 Enlarged prostate with lower urinary tract symptoms: Secondary | ICD-10-CM | POA: Diagnosis not present

## 2023-04-17 DIAGNOSIS — H524 Presbyopia: Secondary | ICD-10-CM | POA: Diagnosis not present

## 2023-04-18 DIAGNOSIS — G4733 Obstructive sleep apnea (adult) (pediatric): Secondary | ICD-10-CM | POA: Diagnosis not present

## 2023-05-23 DIAGNOSIS — G4733 Obstructive sleep apnea (adult) (pediatric): Secondary | ICD-10-CM | POA: Diagnosis not present

## 2023-07-02 DIAGNOSIS — H918X3 Other specified hearing loss, bilateral: Secondary | ICD-10-CM | POA: Diagnosis not present

## 2023-07-13 MED ORDER — SIMVASTATIN 20 MG PO TABS
20 | ORAL_TABLET | Freq: Every day | ORAL | 1 refills | 90.00000 days | Status: DC
Start: 2023-07-13 — End: 2024-01-04

## 2023-07-16 DIAGNOSIS — D485 Neoplasm of uncertain behavior of skin: Secondary | ICD-10-CM | POA: Diagnosis not present

## 2023-07-16 DIAGNOSIS — L501 Idiopathic urticaria: Secondary | ICD-10-CM | POA: Diagnosis not present

## 2023-07-16 DIAGNOSIS — L821 Other seborrheic keratosis: Secondary | ICD-10-CM | POA: Diagnosis not present

## 2023-07-16 DIAGNOSIS — L570 Actinic keratosis: Secondary | ICD-10-CM | POA: Diagnosis not present

## 2023-07-16 DIAGNOSIS — L578 Other skin changes due to chronic exposure to nonionizing radiation: Secondary | ICD-10-CM | POA: Diagnosis not present

## 2023-07-16 DIAGNOSIS — G4733 Obstructive sleep apnea (adult) (pediatric): Secondary | ICD-10-CM | POA: Diagnosis not present

## 2023-07-16 DIAGNOSIS — L859 Epidermal thickening, unspecified: Secondary | ICD-10-CM | POA: Diagnosis not present

## 2023-07-20 ENCOUNTER — Telehealth

## 2023-07-20 NOTE — Telephone Encounter (Signed)
 Pt called in stating he was wondering about blood work and urine results from a month ago. He states they were done at [facility] and the results are in his chart. Please advise.

## 2023-07-20 NOTE — Telephone Encounter (Signed)
 Patient notified and verbalized understanding.

## 2023-07-20 NOTE — Telephone Encounter (Signed)
 His vit d was low take 100oius daily and he had cbcd  and bmp and check his white count was off and his potassium was off

## 2023-07-23 DIAGNOSIS — G4733 Obstructive sleep apnea (adult) (pediatric): Secondary | ICD-10-CM | POA: Diagnosis not present

## 2023-08-23 ENCOUNTER — Ambulatory Visit (INDEPENDENT_AMBULATORY_CARE_PROVIDER_SITE_OTHER)

## 2023-08-23 ENCOUNTER — Ambulatory Visit: Payer: Self-pay | Admitting: Podiatry

## 2023-08-23 ENCOUNTER — Encounter: Payer: Self-pay | Admitting: Podiatry

## 2023-08-23 DIAGNOSIS — M7751 Other enthesopathy of right foot: Secondary | ICD-10-CM

## 2023-08-23 DIAGNOSIS — G4733 Obstructive sleep apnea (adult) (pediatric): Secondary | ICD-10-CM | POA: Diagnosis not present

## 2023-08-24 MED ORDER — TRIAMCINOLONE ACETONIDE 40 MG/ML IJ SUSP
20.0000 mg | Freq: Once | INTRAMUSCULAR | Status: AC
Start: 2023-08-24 — End: ?

## 2023-08-24 NOTE — Progress Notes (Signed)
 Subjective:  Patient ID: Christopher Wilcox., male    DOB: 1950-09-23,  MRN: [id number] HPI Chief Complaint  Patient presents with   Neuroma    Rm7 Possible neuroma right 2nd ims/ 2 months/no treatment    73 y.o. male presents with the above complaint.   ROS: Denies fever chills nausea vomit muscle aches pains calf pain back pain chest pain shortness of breath.  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  , Disp: , Rfl:    amphetamine-dextroamphetamine (ADDERALL) 10 MG tablet, take 1 tablet by mouth daily if needed, Disp: , Rfl: 0   desloratadine (CLARINEX) 5 MG tablet, Take by mouth daily as needed.  , Disp: , Rfl:    doxycycline (VIBRAMYCIN) 100 MG capsule, Take 100 mg by mouth 2 (two) times daily., Disp: , Rfl:    eszopiclone (LUNESTA) 2 MG TABS tablet, , Disp: , Rfl:    fexofenadine (ALLEGRA) 180 MG tablet, Take by mouth daily as needed.  , Disp: , Rfl:    fluticasone (FLONASE) 50 MCG/ACT nasal spray, 2 sprays by Nasal route daily.  , Disp: , Rfl:    ibuprofen (ADVIL) 400 MG tablet, Take by mouth., Disp: , Rfl:    losartan-hydrochlorothiazide (HYZAAR) 100-12.5 MG per tablet, Takes one half of tablet, Disp: , Rfl:    meloxicam (MOBIC) 7.5 MG tablet, Take 1 tablet by mouth daily., Disp: , Rfl:    MYRBETRIQ 25 MG TB24 tablet, Take 25 mg by mouth daily., Disp: , Rfl:    NONFORMULARY OR COMPOUNDED ITEM, Allergy  Vaccine 1:10 Given at [facility], Disp: , Rfl:    tamsulosin (FLOMAX) 0.4 MG CAPS capsule, Take 0.4 mg by mouth  at bedtime., Disp: , Rfl: 0   VOLTAREN 1 % GEL, Apply as directed, Disp: , Rfl:    atorvastatin (LIPITOR) 10 MG tablet, Take 10 mg by mouth at bedtime., Disp: , Rfl: 0   EPINEPHrine  (EPI-PEN) 0.3 mg/0.3 mL SOAJ injection, Inject 0.3 mLs (0.3 mg total) into the muscle once. (Patient not taking: Reported on 08/23/2023), Disp: 1 Device, Rfl: 11  Allergies  Allergen Reactions   Codeine Itching   Meperidine Hcl Nausea And Vomiting   Review of Systems Objective:  There were no vitals filed for this visit.  General: Well developed, nourished, in no acute distress, alert and oriented x3   Dermatological: Skin is warm, dry and supple bilateral. Nails x 10 are well maintained; remaining integument appears unremarkable at this time. There are no open sores, no preulcerative lesions, no rash or signs of infection present.  Vascular: Dorsalis Pedis artery and Posterior Tibial artery pedal pulses are 2/4 bilateral with immedate capillary fill time. Pedal hair growth present. No varicosities and no lower extremity edema present bilateral.   Neruologic: Grossly intact via light touch bilateral. Vibratory intact via tuning fork bilateral. Protective threshold with Semmes Wienstein monofilament intact to all pedal sites bilateral. Patellar and  Achilles deep tendon reflexes 2+ bilateral. No Babinski or clonus noted bilateral.   Musculoskeletal: No gross boney pedal deformities bilateral. No pain, crepitus, or limitation noted with foot and ankle range of motion bilateral. Muscular strength 5/5 in all groups tested bilateral.  Mild hammertoe deformities right foot.  Pain on range of motion at the second metatarsal and third metatarsal phalangeal joints most notably interspace pain of that area as well as the third interdigital space.  No palpable neuromas identified.  Gait: Unassisted, Nonantalgic.    Radiographs:  Radiographs taken today demonstrate osseously mature individual with good bone mineralization.   He does have hammertoe deformities with mild diastases between the 3rd and 4th toes of the right foot possibly indicating neuroma.  Otherwise no significant osseous abnormalities.  He does have a arthritic toe third right with mild swelling distal DIPJ.  Assessment & Plan:   Assessment: Capsulitis 2nd and 3rd metatarsal phalangeal joints.  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  Plan: Discussed appropriate shoe gear.  I injected around the 2nd and 3rd metatarsal phalangeal joints 10 mg Kenalog 5 mg Marcaine point maximal tenderness.  Follow-up with him on an as-needed basis.     Roselyn Doby T. [place], [place]

## 2023-09-24 NOTE — Telephone Encounter (Signed)
 Let him know the blood work was okay

## 2023-09-24 NOTE — Telephone Encounter (Signed)
 Patient calling in and would like to know of his blood work he did 08/20/23

## 2023-09-24 NOTE — Telephone Encounter (Signed)
 Pt.notified

## 2023-10-22 DIAGNOSIS — K080 Exfoliation of teeth due to systemic causes: Secondary | ICD-10-CM | POA: Diagnosis not present

## 2023-12-05 DIAGNOSIS — L570 Actinic keratosis: Secondary | ICD-10-CM | POA: Diagnosis not present

## 2023-12-05 DIAGNOSIS — L821 Other seborrheic keratosis: Secondary | ICD-10-CM | POA: Diagnosis not present

## 2023-12-05 DIAGNOSIS — L578 Other skin changes due to chronic exposure to nonionizing radiation: Secondary | ICD-10-CM | POA: Diagnosis not present

## 2023-12-05 DIAGNOSIS — L820 Inflamed seborrheic keratosis: Secondary | ICD-10-CM | POA: Diagnosis not present

## 2023-12-25 NOTE — Telephone Encounter (Signed)
"  Needs apt with odeh to discuss colorectal cancer screening   "

## 2024-01-04 ENCOUNTER — Encounter

## 2024-01-04 MED ORDER — SIMVASTATIN 20 MG PO TABS
20 | ORAL_TABLET | Freq: Every day | ORAL | 1 refills | 90.00000 days | Status: DC
Start: 2024-01-04 — End: 2024-03-24

## 2024-01-04 NOTE — Telephone Encounter (Signed)
"  LAST VISIT:   03/22/2023     Future Appointments   Date Time Provider Department Center   03/24/2024  3:00 PM Odeh, Neiman T, DO [place]  [facility]      "

## 2024-03-24 ENCOUNTER — Ambulatory Visit
Admit: 2024-03-24 | Discharge: 2024-03-24 | Payer: MEDICARE | Attending: Emergency Medicine | Primary: Emergency Medicine

## 2024-03-24 VITALS — BP 116/70 | HR 74 | Ht 73.0 in | Wt 147.0 lb

## 2024-03-24 DIAGNOSIS — I2510 Atherosclerotic heart disease of native coronary artery without angina pectoris: Principal | ICD-10-CM

## 2024-03-24 MED ORDER — METOPROLOL TARTRATE 25 MG PO TABS
25 | ORAL_TABLET | Freq: Every day | ORAL | 3 refills | 30.00000 days | Status: AC
Start: 2024-03-24 — End: ?

## 2024-03-24 MED ORDER — SIMVASTATIN 20 MG PO TABS
20 | ORAL_TABLET | Freq: Every day | ORAL | 3 refills | 90.00000 days | Status: AC
Start: 2024-03-24 — End: ?

## 2024-03-24 NOTE — Progress Notes (Signed)
 "  [facility] PHYSICIANS  [facility] [place]  FAMILY MEDICINE  [address]  [place]  [place]  [place] [id number]  Dept: [phone]     Date of Visit:  03/24/2024  Patient Name: Ross Parker   Patient DOB:  Oct 23, 1950     Ross Parker is a 74 y.o. male who presents today for an general visit to be evaluated for the following condition(s):  Chief Complaint   Patient presents with    Medicare AWV     Medicare awv no concerns discuss bp        SUBJECTIVE:  HPI   Pt is here for follow up he still feels ok no complains no cp or sob feels ok no dizziness,he does mow the grass in the summer and he does ,with no symptoms,ok, pt is still takingaa 2 times per week.  Patient states he has been doing okay for his age he is not having other complaints he is doing okay with the medications as well.  Current Outpatient Medications   Medication Sig Dispense Refill    metoprolol  tartrate (LOPRESSOR ) 25 MG tablet Take 1 tablet by mouth daily 90 tablet 3    simvastatin  (ZOCOR ) 20 MG tablet Take 1 tablet by mouth daily 90 tablet 3    aspirin 81 MG chewable tablet every 24 hours       No current facility-administered medications for this visit.      No Known Allergies     OBJECTIVE:  Patient Active Problem List   Diagnosis    Atherosclerosis of native coronary artery of native heart without angina pectoris    Disorder of lipid metabolism    Benign prostatic hyperplasia with lower urinary tract symptoms    Vitamin D deficiency    Coronary artery disease involving native coronary artery of native heart without angina pectoris    History of nocturia    Encounter for issue of repeat prescription      No past medical history on file.   No past surgical history on file.   Social History     Socioeconomic History    Marital status: Married   Tobacco Use    Smoking status: Never    Smokeless tobacco: Never   Substance and Sexual Activity    Alcohol use: Never     Social Drivers of Psychologist, [employer] Strain: Low Risk  (03/22/2023)    Overall  Financial Resource Strain (CARDIA)     Difficulty of Paying Living Expenses: Not hard at all   Food Insecurity: No Food Insecurity (03/24/2024)    Hunger Vital Sign     Worried About Running Out of Food in the Last Year: Never true     Ran Out of Food in the Last Year: Never true   Transportation Needs: No Transportation Needs (03/24/2024)    PRAPARE - Therapist, Art (Medical): No     Lack of Transportation (Non-Medical): No   Physical Activity: Insufficiently Active (03/24/2024)    Exercise Vital Sign     Days of Exercise per Week: 5 days     Minutes of Exercise per Session: 10 min   Housing Stability: Low Risk  (03/24/2024)    Housing Stability Vital Sign     Unable to Pay for Housing in the Last Year: No     Number of Times Moved in the Last Year: 0     Homeless in the Last Year: No  Review of Systems  Constitutional: Negative. Negative for activity change, appetite change, chills, diaphoresis, fatigue, fever and unexpected weight changes   HENT:  Negative. Negative for congestion, dental problem, drooling, ear discharge, ear pain, facial swelling, hearing loss, mouth sores, nosebleeds, postnasal drip, rhinorrhea, sinus, pressure, sinus pain, sneezing, sore throat, tinnitus, trouble swallowing and voice change   Eyes:  Negative for photophobia, pain, discharge, redness, itching and visual disturbance.   Respiratory: Negative for apnea, cough, choking, chest tightness, shortness of breath, wheezing and stridor.   Cardiovascular: Negative for chest pain, palpitations and leg swelling   Gastrointestinal: Negative for abdominal distention, abdominal pain, anal bleeding, blood in stool, constipation, diarrhea, nausea, rectal pain and vomiting.   Genitourinary:   Negative for decreased urine volume, difficulty urinating, dysuria, enuresis, flank pain, frequency, genital sores, hematuria and urgency.   Musculoskeletal:   Negative for arthralgias, back pain, gait problem, joint swelling,  myalgias, neck pain and neck stiffness   Skin:  Negative for color change, pallor, rash and wound.   Neurological:   Negative for tremors, seizures, syncope, facial asymmetry, speech difficulty, weakness, light-headedness, numbness and headaches.   Psychiatric/Behavior: Negative for agitation, behavioral problems, confusion, decreased concentration, dysphoric mood, hallucinations, self-injury, sleep disturbance and suicidal ideas.  The patient is not nervous/anxious and is not hyperactive.   Hematological: Negative for adenopathy.  Does not bruise/bleed easily.   Endocrine: Negative for cold intolerance, heat intolerance, polydipsia, polyphagia and polyuria.   Allergic/Immunologic: Negative for environmental allergies, food allergies and immunocompromised state.   Vitals:  BP 116/70   Pulse 74   Ht 1.854 m (6' 1)   Wt 66.7 kg (147 lb)   SpO2 96%   BMI 19.39 kg/m      Physical Exam  General:   Head: Alert, cooperative, no distress, appears stated age.  Normocephalic, without obvious abnormality, atraumatic   Eyes:  Conjunctivae/corneas clear. PERRL, EOMs intact. Fundi benign   Ears:  Normal TMs and external ear canals both ears.   Nose: Nares normal. Septum midline. Mucosa normal. No drainage or sinus tenderness.   Mouth/Throat: Lips, mucosa, and tongue normal. Teeth and gums normal.   Neck: Supple, symmetrical, trachea midline, no adenopathy, thyroid: no enlargement/tenderness/nodules, no carotid bruit and no JVD.   Back:   Symmetric, no curvature. ROM normal. No CVA tenderness.   Lungs:   Clear to auscultation bilaterally.   Heart:  Regular rate and rhythm, S1, S2 normal, no murmur, click, rub or gallop.   Abdomen:   Soft, non-tender. Bowel sounds normal. No masses, No organomegaly.  Rectal was not done patient request stating he has no problems with his prostate or urination   Extremities: Extremities normal, atraumatic, no cyanosis or edema.   Pulses: 2+ and symmetric all extremities.   Skin: Skin color,  texture, turgor normal. No rashes or lesions   Lymph nodes: Cervical, supraclavicular, and axillary nodes normal.   Neurologic: CNII-XII intact. Normal strength, sensation, and reflexes throughout.     ASSESSMENT/PLAN:    1. Atherosclerosis of native coronary artery of native heart without angina pectoris  -     Basic Metabolic Panel; Future  -     CBC with Auto Differential; Future  -     Hepatic Function Panel; Future  -     Lipid, Fasting; Future  -     Magnesium; Future  -     TSH reflex to FT4; Future  -     Uric Acid; Future  -  Urinalysis with Reflex to Culture; Future  -     Vitamin D 25 Hydroxy; Future  -     PSA Screening; Future  2. Disorder of lipid metabolism  -     Basic Metabolic Panel; Future  -     CBC with Auto Differential; Future  -     Hepatic Function Panel; Future  -     Lipid, Fasting; Future  -     Magnesium; Future  -     TSH reflex to FT4; Future  -     Uric Acid; Future  -     Urinalysis with Reflex to Culture; Future  -     Vitamin D 25 Hydroxy; Future  -     PSA Screening; Future  3. Vitamin D deficiency  -     Basic Metabolic Panel; Future  -     CBC with Auto Differential; Future  -     Hepatic Function Panel; Future  -     Lipid, Fasting; Future  -     Magnesium; Future  -     TSH reflex to FT4; Future  -     Uric Acid; Future  -     Urinalysis with Reflex to Culture; Future  -     Vitamin D 25 Hydroxy; Future  -     PSA Screening; Future  4. Coronary artery disease involving native coronary artery of native heart without angina pectoris  -     Basic Metabolic Panel; Future  -     CBC with Auto Differential; Future  -     Hepatic Function Panel; Future  -     Lipid, Fasting; Future  -     Magnesium; Future  -     TSH reflex to FT4; Future  -     Uric Acid; Future  -     Urinalysis with Reflex to Culture; Future  -     Vitamin D 25 Hydroxy; Future  -     PSA Screening; Future  5. History of nocturia  -     Basic Metabolic Panel; Future  -     CBC with Auto Differential; Future  -      Hepatic Function Panel; Future  -     Lipid, Fasting; Future  -     Magnesium; Future  -     TSH reflex to FT4; Future  -     Uric Acid; Future  -     Urinalysis with Reflex to Culture; Future  -     Vitamin D 25 Hydroxy; Future  -     PSA Screening; Future  6. Benign prostatic hyperplasia with nocturia  -     Basic Metabolic Panel; Future  -     CBC with Auto Differential; Future  -     Hepatic Function Panel; Future  -     Lipid, Fasting; Future  -     Magnesium; Future  -     TSH reflex to FT4; Future  -     Uric Acid; Future  -     Urinalysis with Reflex to Culture; Future  -     Vitamin D 25 Hydroxy; Future  -     PSA Screening; Future  7. Encounter for issue of repeat prescription  -     Basic Metabolic Panel; Future  -     CBC with Auto Differential; Future  -  Hepatic Function Panel; Future  -     Lipid, Fasting; Future  -     Magnesium; Future  -     TSH reflex to FT4; Future  -     Uric Acid; Future  -     Urinalysis with Reflex to Culture; Future  -     Vitamin D 25 Hydroxy; Future  -     PSA Screening; Future  8. Hyperkalemia  -     Basic Metabolic Panel; Future  -     CBC with Auto Differential; Future  -     Hepatic Function Panel; Future  -     Lipid, Fasting; Future  -     Magnesium; Future  -     TSH reflex to FT4; Future  -     Uric Acid; Future  -     Urinalysis with Reflex to Culture; Future  -     Vitamin D 25 Hydroxy; Future  -     PSA Screening; Future  9. Well adult exam  -     Basic Metabolic Panel; Future  -     CBC with Auto Differential; Future  -     Hepatic Function Panel; Future  -     Lipid, Fasting; Future  -     Magnesium; Future  -     TSH reflex to FT4; Future  -     Uric Acid; Future  -     Urinalysis with Reflex to Culture; Future  -     Vitamin D 25 Hydroxy; Future  -     PSA Screening; Future  10. Epidermoid cyst of neck  -     Basic Metabolic Panel; Future  -     CBC with Auto Differential; Future  -     Hepatic Function Panel; Future  -     Lipid, Fasting; Future  -      Magnesium; Future  -     TSH reflex to FT4; Future  -     Uric Acid; Future  -     Urinalysis with Reflex to Culture; Future  -     Vitamin D 25 Hydroxy; Future  -     PSA Screening; Future  11. Medication refill  -     Basic Metabolic Panel; Future  -     CBC with Auto Differential; Future  -     Hepatic Function Panel; Future  -     Lipid, Fasting; Future  -     Magnesium; Future  -     TSH reflex to FT4; Future  -     Uric Acid; Future  -     Urinalysis with Reflex to Culture; Future  -     Vitamin D 25 Hydroxy; Future  -     PSA Screening; Future  -     simvastatin  (ZOCOR ) 20 MG tablet; Take 1 tablet by mouth daily, Disp-90 tablet, R-3Normal  12. Need for influenza vaccination  -     Influenza, FLUAD Trivalent, (age 65 y+), IM, Preservative Free, 0.5mL       Return in about 3 months (around 06/22/2024).    PLAN:  RECOMMEND TAKING ASA DAILY  TYLENOL FOR PAIN   WATCH DIET LOW FAT LOW CHOL DIET  DO LBS AND CALL  CONTINUE WITH SAME MEDS  CALL IF ANY PROBLEM  REFILL HIS MEDS   CALL IF ANY PROBLEM  Please note that this chart  was generated using voice recognition Scientist, clinical (histocompatibility and immunogenetics). Although every effort was made to ensure the accuracy of this automatedtranscription, some errors in transcription may have occurred.     Electronically signed by MELVEN ROVER T., DO [facility], 03/24/2024, 6:51 PM   "

## 2024-04-09 ENCOUNTER — Other Ambulatory Visit: Payer: Self-pay | Admitting: Internal Medicine

## 2024-04-09 DIAGNOSIS — R7401 Elevation of levels of liver transaminase levels: Secondary | ICD-10-CM

## 2024-05-07 ENCOUNTER — Other Ambulatory Visit
